# Patient Record
Sex: Male | Born: 1970
Health system: Southern US, Community
[De-identification: ages and names within clinical notes are randomized; demographics above are authoritative.]

## PROBLEM LIST (undated history)

## (undated) DIAGNOSIS — E119 Type 2 diabetes mellitus without complications: Secondary | ICD-10-CM

## (undated) DIAGNOSIS — K219 Gastro-esophageal reflux disease without esophagitis: Secondary | ICD-10-CM

## (undated) DIAGNOSIS — R748 Abnormal levels of other serum enzymes: Secondary | ICD-10-CM

## (undated) DIAGNOSIS — I10 Essential (primary) hypertension: Secondary | ICD-10-CM

## (undated) DIAGNOSIS — J189 Pneumonia, unspecified organism: Secondary | ICD-10-CM

## (undated) HISTORY — DX: Essential (primary) hypertension: I10

## (undated) HISTORY — PX: BACK SURGERY: SHX140

## (undated) HISTORY — DX: Gastro-esophageal reflux disease without esophagitis: K21.9

## (undated) SURGERY — Surgical Case
Anesthesia: *Unknown

---

## 2010-09-21 DIAGNOSIS — I10 Essential (primary) hypertension: Secondary | ICD-10-CM

## 2010-09-21 HISTORY — DX: Essential (primary) hypertension: I10

## 2010-09-21 HISTORY — PX: CARDIAC CATHETERIZATION: SHX172

## 2011-04-27 ENCOUNTER — Emergency Department (HOSPITAL_COMMUNITY)
Admission: EM | Admit: 2011-04-27 | Discharge: 2011-04-27 | Disposition: A | Discharge status: Home / Self Care | Payer: 59 | Attending: Emergency Medicine | Admitting: Emergency Medicine

## 2011-04-27 ENCOUNTER — Emergency Department (HOSPITAL_COMMUNITY): Payer: 59

## 2011-04-27 DIAGNOSIS — I10 Essential (primary) hypertension: Secondary | ICD-10-CM | POA: Insufficient documentation

## 2011-04-27 DIAGNOSIS — K219 Gastro-esophageal reflux disease without esophagitis: Secondary | ICD-10-CM | POA: Insufficient documentation

## 2011-04-27 DIAGNOSIS — Z7982 Long term (current) use of aspirin: Secondary | ICD-10-CM | POA: Insufficient documentation

## 2011-04-27 DIAGNOSIS — K449 Diaphragmatic hernia without obstruction or gangrene: Secondary | ICD-10-CM | POA: Insufficient documentation

## 2011-04-27 DIAGNOSIS — Z79899 Other long term (current) drug therapy: Secondary | ICD-10-CM | POA: Insufficient documentation

## 2011-04-27 DIAGNOSIS — R079 Chest pain, unspecified: Secondary | ICD-10-CM | POA: Insufficient documentation

## 2011-04-27 LAB — COMPREHENSIVE METABOLIC PANEL
ALT: 25 U/L (ref 0–53)
Alkaline Phosphatase: 116 U/L (ref 39–117)
CO2: 25 mEq/L (ref 19–32)
Chloride: 102 mEq/L (ref 96–112)
GFR calc Af Amer: >60 mL/min (ref >60)
Glucose, Bld: 100 mg/dL — ABNORMAL HIGH (ref 70–99)
Potassium: 4 mEq/L (ref 3.5–5.1)
Sodium: 135 mEq/L (ref 135–145)
Total Bilirubin: 0.3 mg/dL (ref 0.3–1.2)
Total Protein: 7.4 g/dL (ref 6.0–8.3)

## 2011-04-27 LAB — POCT I-STAT TROPONIN I: Troponin i, poc: 0.01 ng/mL (ref 0.00–0.08)

## 2011-04-27 LAB — CBC
HCT: 43 % (ref 39.0–52.0)
Hemoglobin: 14.5 g/dL (ref 13.0–17.0)
MCHC: 33.7 g/dL (ref 30.0–36.0)
RBC: 5.27 MIL/uL (ref 4.22–5.81)
WBC: 11.4 10*3/uL — ABNORMAL HIGH (ref 4.0–10.5)

## 2011-04-27 LAB — DIFFERENTIAL
Basophils Absolute: 0 10*3/uL (ref 0.0–0.1)
Basophils Relative: 0 % (ref 0–1)
Lymphocytes Relative: 22 % (ref 12–46)
Neutro Abs: 7.9 10*3/uL — ABNORMAL HIGH (ref 1.7–7.7)
Neutrophils Relative %: 69 % (ref 43–77)

## 2011-04-27 LAB — TROPONIN I: Troponin I: <0.3 ng/mL (ref <0.30)

## 2011-04-30 ENCOUNTER — Inpatient Hospital Stay (HOSPITAL_BASED_OUTPATIENT_CLINIC_OR_DEPARTMENT_OTHER)
Admission: RE | Admit: 2011-04-30 | Discharge: 2011-04-30 | Disposition: A | Discharge status: Home / Self Care | Payer: 59 | Source: Ambulatory Visit | Attending: Cardiology | Admitting: Cardiology

## 2011-04-30 DIAGNOSIS — K219 Gastro-esophageal reflux disease without esophagitis: Secondary | ICD-10-CM | POA: Insufficient documentation

## 2011-04-30 DIAGNOSIS — I10 Essential (primary) hypertension: Secondary | ICD-10-CM | POA: Insufficient documentation

## 2011-04-30 DIAGNOSIS — I2510 Atherosclerotic heart disease of native coronary artery without angina pectoris: Secondary | ICD-10-CM | POA: Insufficient documentation

## 2011-04-30 DIAGNOSIS — F17200 Nicotine dependence, unspecified, uncomplicated: Secondary | ICD-10-CM | POA: Insufficient documentation

## 2011-04-30 DIAGNOSIS — R079 Chest pain, unspecified: Secondary | ICD-10-CM | POA: Insufficient documentation

## 2011-04-30 DIAGNOSIS — R0602 Shortness of breath: Secondary | ICD-10-CM | POA: Insufficient documentation

## 2011-05-07 NOTE — Cardiovascular Report (Signed)
NAME:  HAIDAN, NHAN                 ACCOUNT NO.:  [id number]  MEDICAL RECORD NO.:  [id number]  LOCATION:  [facility]                         FACILITY:  [facility]  PHYSICIAN:  Abryana Lykens N. Sharyn Lull, M.D. DATE OF BIRTH:  [birth year]  DATE OF PROCEDURE:  04/30/2011 DATE OF DISCHARGE:                           CARDIAC CATHETERIZATION   PROCEDURE:  Left cardiac cath with selective left and right coronary angiography, LV graft via right groin using Judkins technique.  INDICATIONS FOR PROCEDURE:  Mr. Winer is 40 year old white male with past medical history significant for hypertension, GERD, morbid obesity, positive family history of coronary artery disease, his brother had MI at the age of 32, requiring PCI.  He came to the office complaining of recurrent retrosternal and precordial chest pain described as pressure, heaviness as if someone is sitting on his chest radiating to the inner side of the left arm associated with shortness of breath off and on for last 2 weeks.  States he was admitted at [facility] for 3 days had stress test which was negative for ischemia, but due to recurrent chest pain went to [facility] ER on Monday with similar chest pain, had lab workup and EKG which were negative and was discharged and was referred to the office for further followup.  The patient states chest pain is not related to food, breathing, or movement.  Denies any palpitation, lightheadedness, or syncope.  Denies PND, orthopnea, leg swelling and states pain occasionally occurs with rest and relieves with aspirin.  EKG done in the office showed normal sinus rhythm with LVH with nonspecific T-wave changes in the inferior leads.  PAST MEDICAL HISTORY:  As above.  PAST SURGICAL HISTORY:  None.  ALLERGIES:  None.  MEDICATIONS AT HOME:  He is on: 1. Propranolol 80 mg p.o. daily. 2. Omeprazole 20 mg p.o. daily. 3. Enteric-coated aspirin 81 mg daily. 4. Nitrostat sublingual p.r.n.  SOCIAL  HISTORY:  He is married, four children.  Smoking, works as Sports administrator, drinks beer occasionally.  No history of drug abuse.  FAMILY HISTORY:  Father died of cancer and mother is alive, she is diabetic and hypertensive.  Brother had MI at the age of 34 requiring PTCA stenting. Two uncles had MI in their 44s and 23s.  He has 6 sisters in good health.  PHYSICAL EXAMINATION:  GENERAL:  He is alert, awake, and oriented x3 in no acute distress. VITAL SIGNS:  Blood pressure was 130/88, pulse was 72 regular. HEENT:  Conjunctivae was pink. NECK:  Supple.  No JVD, no bruit. LUNGS:  Clear to auscultation without rhonchi or rales. Cardiovascular: S1 and S2 was normal.  There was soft systolic murmur. ABDOMEN:  Soft.  Bowel sounds were present, nontender. EXTREMITIES:  There is no clubbing, cyanosis, or edema.  IMPRESSION:  Recurrent chest pain with some features worrisome for angina, rule out coronary insufficiency, hypertension, morbid obesity, positive family history of coronary artery disease.  Discussed with patient and his wife regarding various options of treatment, i.e. medical as he had recent normal stress test versus left cath, its risks and benefits, i.e. death, MI, stroke, local vascular complications, etc. and the patient and his wife are  very concerned about his recurrent chest pain and wanted to proceed with left cath.  PROCEDURE:  After obtaining the informed consent, the patient was brought to the cath lab and was placed on fluoroscopy table.  The right groin was prepped and draped in usual fashion.  A 1% Xylocaine was used for local anesthesia in the right groin.  With the help of thin-wall needle, 4-French arterial sheath was placed.  The sheath was aspirated and flushed.  Next, 4-French left Judkins catheter was advanced over the wire under fluoroscopic guidance up to the ascending aorta.  Wire was pulled out.  The catheter was aspirated and connected to the  manifold. Catheter was further advanced and engaged into left coronary ostium. Multiple views of the left system were taken.  Next, catheter was disengaged and was pulled out over the wire and was replaced with 4- [place] right Judkins catheter which was advanced over the wire under fluoroscopic guidance up to the ascending aorta.  Wire was pulled out. The catheter was aspirated and connected to the manifold.  Catheter was further advanced and engaged into right coronary ostium.  Multiple views of the right system were taken.  Next, catheter was disengaged and was pulled out over the wire and was replaced with 4-French pigtail catheter which was advanced over the wire under fluoroscopic guidance up to the ascending aorta.  Wire was pulled out.  The catheter was aspirated and connected to the manifold.  Catheter was further advanced across the aortic valve into the LV.  LV pressures were recorded.  Next, LV graft was done in 30 degrees RAO position.  Post angiographic pressures were recorded from LV and then pullback pressures were recorded from aorta. There was no gradient across the aortic valve.  Next, the pigtail catheter was pulled out over the wire.  Sheaths were aspirated and flushed.  FINDINGS:  LV showed good LV systolic function, mild LVH, EF of 55-60%. Left main was short which was patent, LAD has 10-15% mid stenosis. Diagonal 1 is small which is patent.  Diagonal 2 is moderate size which is patent.  Left circumflex is patent.  OM1 and OM2 were very very small. OM3 and 4 were small which were patent.  RCA was patent.  PDA and PLV branches were patent.  The patient tolerated procedure well.  There were no complications.  The patient was transferred to recovery room in stable condition.     Eduardo Osier. Sharyn Lull, M.D.     [facility]MED[facility]  D:  04/30/2011  T:  04/30/2011  Job:  [id number]  Electronically Signed by Rinaldo Cloud M.D. on 05/07/2011 08:17:11 PM

## 2016-05-28 ENCOUNTER — Ambulatory Visit (INDEPENDENT_AMBULATORY_CARE_PROVIDER_SITE_OTHER): Payer: Managed Care, Other (non HMO) | Admitting: General Surgery

## 2016-05-28 ENCOUNTER — Encounter: Payer: Self-pay | Admitting: General Surgery

## 2016-05-28 VITALS — BP 148/82 | HR 80 | Resp 18 | Ht 72.0 in | Wt 295.0 lb

## 2016-05-28 DIAGNOSIS — R1032 Left lower quadrant pain: Secondary | ICD-10-CM | POA: Diagnosis not present
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
## 2016-05-28 NOTE — Patient Instructions (Addendum)
The patient is aware to call back for any questions or concerns. .  Recommend trial of anti inflammatory aleve two tablets twice a day, if pain persist then CT scan abdomen

## 2016-05-30 DIAGNOSIS — R1032 Left lower quadrant pain: Secondary | ICD-10-CM | POA: Insufficient documentation

## 2016-10-29 DIAGNOSIS — R7303 Prediabetes: Secondary | ICD-10-CM | POA: Diagnosis not present

## 2016-10-29 DIAGNOSIS — I10 Essential (primary) hypertension: Secondary | ICD-10-CM | POA: Diagnosis not present

## 2016-10-29 DIAGNOSIS — Z23 Encounter for immunization: Secondary | ICD-10-CM | POA: Diagnosis not present

## 2016-10-29 DIAGNOSIS — K219 Gastro-esophageal reflux disease without esophagitis: Secondary | ICD-10-CM | POA: Diagnosis not present

## 2016-10-29 DIAGNOSIS — G43009 Migraine without aura, not intractable, without status migrainosus: Secondary | ICD-10-CM | POA: Diagnosis not present

## 2016-10-29 DIAGNOSIS — E7800 Pure hypercholesterolemia, unspecified: Secondary | ICD-10-CM | POA: Diagnosis not present

## 2017-02-17 DIAGNOSIS — T1501XA Foreign body in cornea, right eye, initial encounter: Secondary | ICD-10-CM | POA: Diagnosis not present

## 2017-02-19 DIAGNOSIS — T1501XD Foreign body in cornea, right eye, subsequent encounter: Secondary | ICD-10-CM | POA: Diagnosis not present

## 2017-04-29 DIAGNOSIS — G43009 Migraine without aura, not intractable, without status migrainosus: Secondary | ICD-10-CM | POA: Diagnosis not present

## 2017-04-29 DIAGNOSIS — R7303 Prediabetes: Secondary | ICD-10-CM | POA: Diagnosis not present

## 2017-04-29 DIAGNOSIS — E7800 Pure hypercholesterolemia, unspecified: Secondary | ICD-10-CM | POA: Diagnosis not present

## 2017-04-29 DIAGNOSIS — I10 Essential (primary) hypertension: Secondary | ICD-10-CM | POA: Diagnosis not present

## 2017-08-08 DIAGNOSIS — Z23 Encounter for immunization: Secondary | ICD-10-CM | POA: Diagnosis not present

## 2017-11-09 DIAGNOSIS — G43009 Migraine without aura, not intractable, without status migrainosus: Secondary | ICD-10-CM | POA: Diagnosis not present

## 2017-11-09 DIAGNOSIS — I10 Essential (primary) hypertension: Secondary | ICD-10-CM | POA: Diagnosis not present

## 2017-11-09 DIAGNOSIS — E119 Type 2 diabetes mellitus without complications: Secondary | ICD-10-CM | POA: Diagnosis not present

## 2017-11-09 DIAGNOSIS — E7800 Pure hypercholesterolemia, unspecified: Secondary | ICD-10-CM | POA: Diagnosis not present

## 2018-05-11 DIAGNOSIS — I10 Essential (primary) hypertension: Secondary | ICD-10-CM | POA: Diagnosis not present

## 2018-05-11 DIAGNOSIS — E7800 Pure hypercholesterolemia, unspecified: Secondary | ICD-10-CM | POA: Diagnosis not present

## 2018-05-11 DIAGNOSIS — Z79899 Other long term (current) drug therapy: Secondary | ICD-10-CM | POA: Diagnosis not present

## 2018-05-11 DIAGNOSIS — E119 Type 2 diabetes mellitus without complications: Secondary | ICD-10-CM | POA: Diagnosis not present

## 2018-11-11 DIAGNOSIS — E119 Type 2 diabetes mellitus without complications: Secondary | ICD-10-CM | POA: Diagnosis not present

## 2018-11-11 DIAGNOSIS — E7800 Pure hypercholesterolemia, unspecified: Secondary | ICD-10-CM | POA: Diagnosis not present

## 2018-11-11 DIAGNOSIS — I10 Essential (primary) hypertension: Secondary | ICD-10-CM | POA: Diagnosis not present

## 2020-04-03 ENCOUNTER — Other Ambulatory Visit: Payer: Self-pay | Admitting: Nurse Practitioner

## 2020-04-04 ENCOUNTER — Other Ambulatory Visit: Payer: Self-pay | Admitting: Nurse Practitioner

## 2020-04-04 DIAGNOSIS — M5442 Lumbago with sciatica, left side: Secondary | ICD-10-CM

## 2020-04-30 ENCOUNTER — Ambulatory Visit
Admission: RE | Admit: 2020-04-30 | Discharge: 2020-04-30 | Disposition: A | Discharge status: Home / Self Care | Payer: Worker's Compensation | Source: Ambulatory Visit | Attending: Nurse Practitioner | Admitting: Nurse Practitioner

## 2020-04-30 ENCOUNTER — Other Ambulatory Visit: Payer: Self-pay

## 2020-04-30 DIAGNOSIS — M5442 Lumbago with sciatica, left side: Secondary | ICD-10-CM

## 2020-07-17 ENCOUNTER — Other Ambulatory Visit: Payer: Self-pay | Admitting: Orthopedic Surgery

## 2020-07-22 NOTE — Pre-Procedure Instructions (Addendum)
[id number] - [place], [place] [address] Phone: [phone] Fax: [phone]    Your procedure is scheduled on Thurs., Nov. 4, 2021 from 12:17PM-2:38PM  Report to [facility] Entrance "A" at 9:15AM   Call this number if you have problems the morning of surgery:  [phone]   Remember:  Do not eat after midnight on Nov. 3rd  You may drink clear liquids until (8:15AM) prior to surgery time .  Clear liquids allowed are: Water, Juice (Non-Citric, no pulp), Black Coffee Only (no dairy or creamer), Clear Tea (no dairy or creamer), Carbonated Beverages, Gatorade, Plain Popsicles, Plain Jell-O.  Enhanced Recovery after Surgery for Orthopedics Enhanced Recovery after Surgery is a protocol used to improve the stress on your body and your recovery after surgery.  Patient Instructions .  Marland Kitchen The day of surgery (if you do NOT have diabetes):  o Drink ONE (1) Pre-Surgery Clear Ensure by __8:15AM___  the morning of surgery   o This drink was given to you during your hospital  pre-op appointment visit. o Nothing else to drink after completing the  Pre-Surgery Clear Ensure.         If you have questions, please contact your surgeon's office.     Take these medicines the morning of surgery with A SIP OF WATER: HYDROcodone-acetaminophen (NORCO/VICODIN) Pantoprazole (PROTONIX)     Propranolol ER (INDERAL LA)  As of today, STOP taking all Aspirin (unless instructed by your doctor) and Other Aspirin containing products, Vitamins, Fish oils, and Herbal medications. Also stop all NSAIDS i.e. Advil, Ibuprofen, Motrin, Aleve, Anaprox, Naproxen, BC, Goody Powders, and all Supplements.   No Smoking of any kind, Tobacco/Vaping, or Alcohol products 24 hours prior to your procedure. If you use a Cpap at night, you may bring all equipment for your overnight stay.   Special instructions:   [place]- Preparing  For Surgery  Before surgery, you can play an important role. Because skin is not sterile, your skin needs to be as free of germs as possible. You can reduce the number of germs on your skin by washing with CHG (chlorahexidine gluconate) Soap before surgery.  CHG is an antiseptic cleaner which kills germs and bonds with the skin to continue killing germs even after washing.    Please do not use if you have an allergy to CHG or antibacterial soaps. If your skin becomes reddened/irritated stop using the CHG.  Do not shave (including legs and underarms) for at least 48 hours prior to first CHG shower. It is OK to shave your face.  Please follow these instructions carefully.   1. Shower the NIGHT BEFORE SURGERY and the MORNING OF SURGERY with CHG.   2. If you chose to wash your hair, wash your hair first as usual with your normal shampoo.  3. After you shampoo, rinse your hair and body thoroughly to remove the shampoo.  4. Use CHG as you would any other liquid soap. You can apply CHG directly to the skin and wash gently with a scrungie or a clean washcloth.   5. Apply the CHG Soap to your body ONLY FROM THE NECK DOWN.  Do not use on open wounds or open sores. Avoid contact with your eyes, ears, mouth and genitals (private parts). Wash Face and genitals (private parts)  with your normal soap.  6. Wash thoroughly, paying special attention to the area where your surgery will be performed.  7. Thoroughly rinse your body with warm water from the neck down.  8. DO NOT shower/wash with your normal soap after using and rinsing off the CHG Soap.  9. Pat yourself dry with a CLEAN TOWEL.  10. Wear CLEAN PAJAMAS to bed the night before surgery, wear comfortable clothes the morning of surgery  11. Place CLEAN SHEETS on your bed the night of your first shower and DO NOT SLEEP WITH PETS.   Day of Surgery:             Remember to brush your teeth WITH YOUR REGULAR TOOTHPASTE.   Do not wear jewelry.  Do  not wear lotions, powders, colognes, or deodorant.  Do not shave 48 hours prior to surgery.  Men may shave face and neck.  Do not bring valuables to the hospital.  [facility] is not responsible for any belongings or valuables.  Contacts, dentures or bridgework may not be worn into surgery.  For patients admitted to the hospital, discharge time will be determined by your treatment team.  Patients discharged the day of surgery will not be allowed to drive home, and someone age 50 and over needs to stay with them for 24 hours.  Please wear clean clothes to the hospital/surgery center.    Please read over the following fact sheets that you were given.

## 2020-07-23 ENCOUNTER — Encounter (HOSPITAL_COMMUNITY)
Admission: RE | Admit: 2020-07-23 | Discharge: 2020-07-23 | Disposition: A | Discharge status: Home / Self Care | Payer: Worker's Compensation | Source: Ambulatory Visit | Attending: Orthopedic Surgery | Admitting: Orthopedic Surgery

## 2020-07-23 ENCOUNTER — Other Ambulatory Visit: Payer: Self-pay

## 2020-07-23 ENCOUNTER — Encounter (HOSPITAL_COMMUNITY): Payer: Self-pay

## 2020-07-23 ENCOUNTER — Other Ambulatory Visit (HOSPITAL_COMMUNITY)
Admission: RE | Admit: 2020-07-23 | Discharge: 2020-07-23 | Disposition: A | Discharge status: Home / Self Care | Payer: 59 | Source: Ambulatory Visit | Attending: Orthopedic Surgery | Admitting: Orthopedic Surgery

## 2020-07-23 DIAGNOSIS — Z01812 Encounter for preprocedural laboratory examination: Secondary | ICD-10-CM | POA: Insufficient documentation

## 2020-07-23 DIAGNOSIS — Z20822 Contact with and (suspected) exposure to covid-19: Secondary | ICD-10-CM | POA: Insufficient documentation

## 2020-07-23 LAB — CBC WITH DIFFERENTIAL/PLATELET
Abs Immature Granulocytes: 0.05 10*3/uL (ref 0.00–0.07)
Basophils Absolute: 0 10*3/uL (ref 0.0–0.1)
Basophils Relative: 0 %
Eosinophils Absolute: 0.1 10*3/uL (ref 0.0–0.5)
Eosinophils Relative: 1 %
HCT: 46.1 % (ref 39.0–52.0)
Hemoglobin: 14.7 g/dL (ref 13.0–17.0)
Immature Granulocytes: 1 %
Lymphocytes Relative: 20 %
Lymphs Abs: 2.1 10*3/uL (ref 0.7–4.0)
MCH: 27.3 pg (ref 26.0–34.0)
MCHC: 31.9 g/dL (ref 30.0–36.0)
MCV: 85.5 fL (ref 80.0–100.0)
Monocytes Absolute: 0.7 10*3/uL (ref 0.1–1.0)
Monocytes Relative: 7 %
Neutro Abs: 7.7 10*3/uL (ref 1.7–7.7)
Neutrophils Relative %: 71 %
Platelets: 317 10*3/uL (ref 150–400)
RBC: 5.39 MIL/uL (ref 4.22–5.81)
RDW: 11.9 % (ref 11.5–15.5)
WBC: 10.7 10*3/uL — ABNORMAL HIGH (ref 4.0–10.5)
nRBC: 0 % (ref 0.0–0.2)

## 2020-07-23 LAB — URINALYSIS, ROUTINE W REFLEX MICROSCOPIC
Bilirubin Urine: NEGATIVE
Glucose, UA: NEGATIVE mg/dL
Hgb urine dipstick: NEGATIVE
Ketones, ur: NEGATIVE mg/dL
Leukocytes,Ua: NEGATIVE
Nitrite: NEGATIVE
Protein, ur: NEGATIVE mg/dL
Specific Gravity, Urine: 1.019 (ref 1.005–1.030)
pH: 7 (ref 5.0–8.0)

## 2020-07-23 LAB — COMPREHENSIVE METABOLIC PANEL
ALT: 51 U/L — ABNORMAL HIGH (ref 0–44)
AST: 48 U/L — ABNORMAL HIGH (ref 15–41)
Albumin: 4 g/dL (ref 3.5–5.0)
Alkaline Phosphatase: 103 U/L (ref 38–126)
Anion gap: 13 (ref 5–15)
BUN: 8 mg/dL (ref 6–20)
CO2: 23 mmol/L (ref 22–32)
Calcium: 9.2 mg/dL (ref 8.9–10.3)
Chloride: 100 mmol/L (ref 98–111)
Creatinine, Ser: 0.85 mg/dL (ref 0.61–1.24)
GFR, Estimated: >60 mL/min (ref >60)
Glucose, Bld: 206 mg/dL — ABNORMAL HIGH (ref 70–99)
Potassium: 4.2 mmol/L (ref 3.5–5.1)
Sodium: 136 mmol/L (ref 135–145)
Total Bilirubin: 0.8 mg/dL (ref 0.3–1.2)
Total Protein: 7.7 g/dL (ref 6.5–8.1)

## 2020-07-23 LAB — SURGICAL PCR SCREEN
MRSA, PCR: NEGATIVE
Staphylococcus aureus: NEGATIVE

## 2020-07-23 LAB — TYPE AND SCREEN
ABO/RH(D): O POS
Antibody Screen: NEGATIVE

## 2020-07-23 LAB — PROTIME-INR
INR: 1 (ref 0.8–1.2)
Prothrombin Time: 13.2 seconds (ref 11.4–15.2)

## 2020-07-23 LAB — APTT: aPTT: 30 seconds (ref 24–36)

## 2020-07-23 LAB — SARS CORONAVIRUS 2 (TAT 6-24 HRS): SARS Coronavirus 2: NEGATIVE

## 2020-07-23 NOTE — Progress Notes (Signed)
PCP - Cyndi Bender, PA-C Cardiologist - denies  PPM/ICD - denies  Chest x-ray - N/A EKG - 07/23/2020 Stress Test - denies ECHO - denies Cardiac Cath - per patient, 2012, no abnormal results, "doctor thought had blocked arteries but turned out to be a pulled muscle"  Sleep Study - denies CPAP -  N/A  DM: denies  Blood Thinner Instructions: N/A Aspirin Instructions: N/A  ERAS Protcol - Yes PRE-SURGERY Ensure or G2- Ensure given  COVID TEST- Scheduled for today 07/23/2020. Patient verbalized the understanding of self-quarantine instructions, appointment time and place.  Anesthesia review: YES, abnormal EKG  Patient denies shortness of breath, fever, cough and chest pain at PAT appointment  All instructions explained to the patient, with a verbal understanding of the material. Patient agrees to go over the instructions while at home for a better understanding. Patient also instructed to self quarantine after being tested for COVID-19. The opportunity to ask questions was provided.

## 2020-07-25 ENCOUNTER — Ambulatory Visit (HOSPITAL_COMMUNITY): Payer: Worker's Compensation | Admitting: Anesthesiology

## 2020-07-25 ENCOUNTER — Ambulatory Visit (HOSPITAL_COMMUNITY)
Admission: RE | Admit: 2020-07-25 | Discharge: 2020-07-25 | Disposition: A | Discharge status: Home / Self Care | Payer: Worker's Compensation | Attending: Orthopedic Surgery | Admitting: Orthopedic Surgery

## 2020-07-25 ENCOUNTER — Ambulatory Visit (HOSPITAL_COMMUNITY)
Admission: RE | Disposition: A | Discharge status: Home / Self Care | Payer: Self-pay | Source: Home / Self Care | Attending: Orthopedic Surgery

## 2020-07-25 ENCOUNTER — Ambulatory Visit (HOSPITAL_COMMUNITY): Payer: Worker's Compensation | Attending: Orthopedic Surgery

## 2020-07-25 ENCOUNTER — Other Ambulatory Visit: Payer: Self-pay

## 2020-07-25 ENCOUNTER — Ambulatory Visit (HOSPITAL_COMMUNITY): Payer: Worker's Compensation | Admitting: Physician Assistant

## 2020-07-25 ENCOUNTER — Encounter (HOSPITAL_COMMUNITY): Payer: Self-pay | Admitting: Orthopedic Surgery

## 2020-07-25 DIAGNOSIS — Z6841 Body Mass Index (BMI) 40.0 and over, adult: Secondary | ICD-10-CM | POA: Insufficient documentation

## 2020-07-25 DIAGNOSIS — M5410 Radiculopathy, site unspecified: Secondary | ICD-10-CM | POA: Insufficient documentation

## 2020-07-25 DIAGNOSIS — M5106 Intervertebral disc disorders with myelopathy, lumbar region: Secondary | ICD-10-CM | POA: Diagnosis not present

## 2020-07-25 DIAGNOSIS — M5116 Intervertebral disc disorders with radiculopathy, lumbar region: Secondary | ICD-10-CM | POA: Diagnosis not present

## 2020-07-25 HISTORY — PX: LUMBAR LAMINECTOMY/DECOMPRESSION MICRODISCECTOMY: SHX5026

## 2020-07-25 LAB — ABO/RH: ABO/RH(D): O POS

## 2020-07-25 SURGERY — LUMBAR LAMINECTOMY/DECOMPRESSION MICRODISCECTOMY
Anesthesia: General | Laterality: Left

## 2020-07-25 MED ORDER — ORAL CARE MOUTH RINSE: 15.0000 mL | Freq: Once | OROMUCOSAL | Status: AC

## 2020-07-25 MED ORDER — METHYLPREDNISOLONE ACETATE 40 MG/ML IJ SUSP
INTRAMUSCULAR | Status: AC
  Filled 2020-07-25: qty 1

## 2020-07-25 MED ORDER — PROMETHAZINE HCL 25 MG/ML IJ SOLN: 6.2500 mg | INTRAMUSCULAR | Status: DC | PRN

## 2020-07-25 MED ORDER — BUPIVACAINE LIPOSOME 1.3 % IJ SUSP
INTRAMUSCULAR | Status: DC | PRN
  Administered 2020-07-25: 20 mL

## 2020-07-25 MED ORDER — ROCURONIUM BROMIDE 10 MG/ML (PF) SYRINGE
PREFILLED_SYRINGE | INTRAVENOUS | Status: DC | PRN
  Administered 2020-07-25: 20 mg via INTRAVENOUS
  Administered 2020-07-25: 80 mg via INTRAVENOUS

## 2020-07-25 MED ORDER — PHENYLEPHRINE HCL-NACL 10-0.9 MG/250ML-% IV SOLN
INTRAVENOUS | Status: DC | PRN
  Administered 2020-07-25: 25 ug/min via INTRAVENOUS

## 2020-07-25 MED ORDER — MIDAZOLAM HCL 2 MG/2ML IJ SOLN
INTRAMUSCULAR | Status: DC | PRN
  Administered 2020-07-25: 2 mg via INTRAVENOUS

## 2020-07-25 MED ORDER — SUGAMMADEX SODIUM 200 MG/2ML IV SOLN
INTRAVENOUS | Status: DC | PRN
  Administered 2020-07-25: 300 mg via INTRAVENOUS

## 2020-07-25 MED ORDER — DEXAMETHASONE SODIUM PHOSPHATE 10 MG/ML IJ SOLN
INTRAMUSCULAR | Status: DC | PRN
  Administered 2020-07-25: 4 mg via INTRAVENOUS

## 2020-07-25 MED ORDER — ONDANSETRON HCL 4 MG/2ML IJ SOLN
INTRAMUSCULAR | Status: AC
  Filled 2020-07-25: qty 2

## 2020-07-25 MED ORDER — METHOCARBAMOL 500 MG PO TABS
500.0000 mg | ORAL_TABLET | Freq: Once | ORAL | Status: AC
  Administered 2020-07-25: 500 mg via ORAL

## 2020-07-25 MED ORDER — HYDROMORPHONE HCL 1 MG/ML IJ SOLN
INTRAMUSCULAR | Status: AC
  Filled 2020-07-25: qty 1

## 2020-07-25 MED ORDER — CEFAZOLIN SODIUM-DEXTROSE 2-4 GM/100ML-% IV SOLN
INTRAVENOUS | Status: AC
  Filled 2020-07-25: qty 100

## 2020-07-25 MED ORDER — CHLORHEXIDINE GLUCONATE 0.12 % MT SOLN
OROMUCOSAL | Status: AC
  Administered 2020-07-25: 15 mL via OROMUCOSAL
  Filled 2020-07-25: qty 15

## 2020-07-25 MED ORDER — EPHEDRINE 5 MG/ML INJ
INTRAVENOUS | Status: AC
  Filled 2020-07-25: qty 10

## 2020-07-25 MED ORDER — POVIDONE-IODINE 7.5 % EX SOLN
Freq: Once | CUTANEOUS | Status: AC
  Administered 2020-07-25: 1 via TOPICAL
  Filled 2020-07-25: qty 118

## 2020-07-25 MED ORDER — ONDANSETRON HCL 4 MG/2ML IJ SOLN
INTRAMUSCULAR | Status: DC | PRN
  Administered 2020-07-25: 4 mg via INTRAVENOUS

## 2020-07-25 MED ORDER — CEFAZOLIN SODIUM 1 G IJ SOLR
INTRAMUSCULAR | Status: AC
  Filled 2020-07-25: qty 10

## 2020-07-25 MED ORDER — MIDAZOLAM HCL 2 MG/2ML IJ SOLN
INTRAMUSCULAR | Status: AC
  Filled 2020-07-25: qty 2

## 2020-07-25 MED ORDER — SODIUM CHLORIDE (PF) 0.9 % IJ SOLN
INTRAMUSCULAR | Status: AC
  Filled 2020-07-25: qty 10

## 2020-07-25 MED ORDER — THROMBIN 20000 UNITS EX SOLR
CUTANEOUS | Status: DC | PRN
  Administered 2020-07-25: 10 mL via TOPICAL

## 2020-07-25 MED ORDER — MEPERIDINE HCL 25 MG/ML IJ SOLN: 6.2500 mg | INTRAMUSCULAR | Status: DC | PRN

## 2020-07-25 MED ORDER — METHYLENE BLUE 0.5 % INJ SOLN
INTRAVENOUS | Status: AC
  Filled 2020-07-25: qty 10

## 2020-07-25 MED ORDER — FENTANYL CITRATE (PF) 250 MCG/5ML IJ SOLN
INTRAMUSCULAR | Status: AC
  Filled 2020-07-25: qty 5

## 2020-07-25 MED ORDER — LIDOCAINE 2% (20 MG/ML) 5 ML SYRINGE
INTRAMUSCULAR | Status: AC
  Filled 2020-07-25: qty 5

## 2020-07-25 MED ORDER — PHENYLEPHRINE 40 MCG/ML (10ML) SYRINGE FOR IV PUSH (FOR BLOOD PRESSURE SUPPORT)
PREFILLED_SYRINGE | INTRAVENOUS | Status: DC | PRN
  Administered 2020-07-25: 120 ug via INTRAVENOUS

## 2020-07-25 MED ORDER — PROPOFOL 10 MG/ML IV BOLUS
INTRAVENOUS | Status: AC
  Filled 2020-07-25: qty 20

## 2020-07-25 MED ORDER — CEFAZOLIN SODIUM-DEXTROSE 2-4 GM/100ML-% IV SOLN: 2.0000 g | INTRAVENOUS | Status: DC

## 2020-07-25 MED ORDER — BUPIVACAINE-EPINEPHRINE 0.25% -1:200000 IJ SOLN
INTRAMUSCULAR | Status: DC | PRN
  Administered 2020-07-25: 20 mL

## 2020-07-25 MED ORDER — LIDOCAINE 2% (20 MG/ML) 5 ML SYRINGE
INTRAMUSCULAR | Status: DC | PRN
  Administered 2020-07-25: 100 mg via INTRAVENOUS

## 2020-07-25 MED ORDER — METHYLENE BLUE 0.5 % INJ SOLN
INTRAVENOUS | Status: DC | PRN
  Administered 2020-07-25: 1 mL via SUBMUCOSAL

## 2020-07-25 MED ORDER — HYDROMORPHONE HCL 1 MG/ML IJ SOLN
0.2500 mg | INTRAMUSCULAR | Status: DC | PRN
  Administered 2020-07-25 (×2): 0.5 mg via INTRAVENOUS

## 2020-07-25 MED ORDER — DEXAMETHASONE SODIUM PHOSPHATE 10 MG/ML IJ SOLN
INTRAMUSCULAR | Status: AC
  Filled 2020-07-25: qty 1

## 2020-07-25 MED ORDER — THROMBIN (RECOMBINANT) 20000 UNITS EX SOLR
CUTANEOUS | Status: AC
  Filled 2020-07-25: qty 20000

## 2020-07-25 MED ORDER — CHLORHEXIDINE GLUCONATE 0.12 % MT SOLN: 15.0000 mL | Freq: Once | OROMUCOSAL | Status: AC

## 2020-07-25 MED ORDER — METHOCARBAMOL 500 MG PO TABS
ORAL_TABLET | ORAL | Status: AC
  Filled 2020-07-25: qty 1

## 2020-07-25 MED ORDER — CHLORHEXIDINE GLUCONATE 0.12 % MT SOLN
OROMUCOSAL | Status: AC
  Filled 2020-07-25: qty 15

## 2020-07-25 MED ORDER — PROPOFOL 10 MG/ML IV BOLUS
INTRAVENOUS | Status: DC | PRN
  Administered 2020-07-25: 80 mg via INTRAVENOUS
  Administered 2020-07-25: 200 mg via INTRAVENOUS

## 2020-07-25 MED ORDER — BUPIVACAINE LIPOSOME 1.3 % IJ SUSP
20.0000 mL | Freq: Once | INTRAMUSCULAR | Status: DC
  Filled 2020-07-25: qty 20

## 2020-07-25 MED ORDER — ROCURONIUM BROMIDE 10 MG/ML (PF) SYRINGE
PREFILLED_SYRINGE | INTRAVENOUS | Status: AC
  Filled 2020-07-25: qty 10

## 2020-07-25 MED ORDER — 0.9 % SODIUM CHLORIDE (POUR BTL) OPTIME
TOPICAL | Status: DC | PRN
  Administered 2020-07-25: 1000 mL

## 2020-07-25 MED ORDER — BUPIVACAINE-EPINEPHRINE (PF) 0.25% -1:200000 IJ SOLN
INTRAMUSCULAR | Status: AC
  Filled 2020-07-25: qty 30

## 2020-07-25 MED ORDER — EPHEDRINE SULFATE-NACL 50-0.9 MG/10ML-% IV SOSY
PREFILLED_SYRINGE | INTRAVENOUS | Status: DC | PRN
  Administered 2020-07-25: 10 mg via INTRAVENOUS

## 2020-07-25 MED ORDER — LACTATED RINGERS IV SOLN: INTRAVENOUS | Status: DC

## 2020-07-25 MED ORDER — DEXTROSE 5 % IV SOLN
INTRAVENOUS | Status: DC | PRN
  Administered 2020-07-25: 3 g via INTRAVENOUS

## 2020-07-25 MED ORDER — FENTANYL CITRATE (PF) 250 MCG/5ML IJ SOLN
INTRAMUSCULAR | Status: DC | PRN
  Administered 2020-07-25: 100 ug via INTRAVENOUS
  Administered 2020-07-25: 50 ug via INTRAVENOUS

## 2020-07-25 SURGICAL SUPPLY — 64 items
BENZOIN TINCTURE PRP APPL 2/3 (GAUZE/BANDAGES/DRESSINGS) ×4 IMPLANT
BNDG GAUZE ELAST 4 BULKY (GAUZE/BANDAGES/DRESSINGS) ×2 IMPLANT
BUR ROUND PRECISION 4.0 (BURR) ×2 IMPLANT
CABLE BIPOLOR RESECTION CORD (MISCELLANEOUS) ×2 IMPLANT
CANISTER SUCT 3000ML PPV (MISCELLANEOUS) ×2 IMPLANT
CARTRIDGE OIL MAESTRO DRILL (MISCELLANEOUS) ×1 IMPLANT
CLOSURE STERI-STRIP 1/4X4 (GAUZE/BANDAGES/DRESSINGS) ×2 IMPLANT
COVER SURGICAL LIGHT HANDLE (MISCELLANEOUS) ×2 IMPLANT
COVER WAND RF STERILE (DRAPES) ×2 IMPLANT
DIFFUSER DRILL AIR PNEUMATIC (MISCELLANEOUS) ×2 IMPLANT
DRAPE POUCH INSTRU U-SHP 10X18 (DRAPES) ×4 IMPLANT
DRAPE SURG 17X23 STRL (DRAPES) ×8 IMPLANT
DURAPREP 26ML APPLICATOR (WOUND CARE) ×2 IMPLANT
ELECT BLADE 4.0 EZ CLEAN MEGAD (MISCELLANEOUS) ×2
ELECT CAUTERY BLADE 6.4 (BLADE) ×2 IMPLANT
ELECT REM PT RETURN 9FT ADLT (ELECTROSURGICAL) ×2
ELECTRODE BLDE 4.0 EZ CLN MEGD (MISCELLANEOUS) ×1 IMPLANT
ELECTRODE REM PT RTRN 9FT ADLT (ELECTROSURGICAL) ×1 IMPLANT
FILTER STRAW FLUID ASPIR (MISCELLANEOUS) ×2 IMPLANT
GAUZE 4X4 16PLY RFD (DISPOSABLE) ×4 IMPLANT
GAUZE SPONGE 4X4 12PLY STRL (GAUZE/BANDAGES/DRESSINGS) ×2 IMPLANT
GLOVE BIO SURGEON STRL SZ7 (GLOVE) ×2 IMPLANT
GLOVE BIO SURGEON STRL SZ8 (GLOVE) ×2 IMPLANT
GLOVE BIOGEL PI IND STRL 7.0 (GLOVE) ×1 IMPLANT
GLOVE BIOGEL PI IND STRL 8 (GLOVE) ×1 IMPLANT
GLOVE BIOGEL PI INDICATOR 7.0 (GLOVE) ×1
GLOVE BIOGEL PI INDICATOR 8 (GLOVE) ×1
GOWN STRL REUS W/ TWL LRG LVL3 (GOWN DISPOSABLE) ×1 IMPLANT
GOWN STRL REUS W/ TWL XL LVL3 (GOWN DISPOSABLE) ×2 IMPLANT
GOWN STRL REUS W/TWL LRG LVL3 (GOWN DISPOSABLE) ×1
GOWN STRL REUS W/TWL XL LVL3 (GOWN DISPOSABLE) ×2
IV CATH 14GX2 1/4 (CATHETERS) ×2 IMPLANT
KIT BASIN OR (CUSTOM PROCEDURE TRAY) ×2 IMPLANT
KIT POSITION SURG JACKSON T1 (MISCELLANEOUS) ×2 IMPLANT
KIT TURNOVER KIT B (KITS) ×2 IMPLANT
MARKER SKIN DUAL TIP RULER LAB (MISCELLANEOUS) ×2 IMPLANT
NEEDLE 18GX1X1/2 (RX/OR ONLY) (NEEDLE) ×2 IMPLANT
NEEDLE 22X1 1/2 (OR ONLY) (NEEDLE) ×2 IMPLANT
NEEDLE HYPO 25GX1X1/2 BEV (NEEDLE) ×2 IMPLANT
NEEDLE SPNL 18GX3.5 QUINCKE PK (NEEDLE) ×4 IMPLANT
NS IRRIG 1000ML POUR BTL (IV SOLUTION) ×2 IMPLANT
OIL CARTRIDGE MAESTRO DRILL (MISCELLANEOUS) ×2
PACK LAMINECTOMY ORTHO (CUSTOM PROCEDURE TRAY) ×2 IMPLANT
PACK UNIVERSAL I (CUSTOM PROCEDURE TRAY) ×2 IMPLANT
PAD ARMBOARD 7.5X6 YLW CONV (MISCELLANEOUS) ×4 IMPLANT
PATTIES SURGICAL .5 X.5 (GAUZE/BANDAGES/DRESSINGS) ×2 IMPLANT
PATTIES SURGICAL .5 X1 (DISPOSABLE) ×2 IMPLANT
SPONGE INTESTINAL PEANUT (DISPOSABLE) ×2 IMPLANT
SPONGE SURGIFOAM ABS GEL SZ50 (HEMOSTASIS) ×2 IMPLANT
STRIP CLOSURE SKIN 1/2X4 (GAUZE/BANDAGES/DRESSINGS) ×2 IMPLANT
SUT MNCRL AB 4-0 PS2 18 (SUTURE) ×2 IMPLANT
SUT VIC AB 1 CT1 18XCR BRD 8 (SUTURE) ×1 IMPLANT
SUT VIC AB 1 CT1 8-18 (SUTURE) ×1
SUT VIC AB 2-0 CT2 18 VCP726D (SUTURE) ×2 IMPLANT
SYR 20ML LL LF (SYRINGE) ×2 IMPLANT
SYR BULB IRRIG 60ML STRL (SYRINGE) ×2 IMPLANT
SYR CONTROL 10ML LL (SYRINGE) ×4 IMPLANT
SYR TB 1ML 25GX5/8 (SYRINGE) ×4 IMPLANT
SYR TB 1ML LUER SLIP (SYRINGE) ×4 IMPLANT
TAPE CLOTH SURG 4X10 WHT LF (GAUZE/BANDAGES/DRESSINGS) ×2 IMPLANT
TOWEL GREEN STERILE (TOWEL DISPOSABLE) ×2 IMPLANT
TOWEL GREEN STERILE FF (TOWEL DISPOSABLE) ×2 IMPLANT
WATER STERILE IRR 1000ML POUR (IV SOLUTION) ×2 IMPLANT
YANKAUER SUCT BULB TIP NO VENT (SUCTIONS) ×2 IMPLANT

## 2020-07-25 NOTE — Progress Notes (Signed)
Patient ambulated the length of PACU, tolerated walk well, no numbness or tingling, strong dorsi and plantar, sitting in recliner, VS stable.  Rowe Pavy, RN

## 2020-07-25 NOTE — Op Note (Signed)
PATIENT NAME: Brandon Marshall   MEDICAL RECORD NO.:   [id number]   DATE OF BIRTH: 1971-03-05   DATE OF PROCEDURE: 07/25/2020                                       OPERATIVE REPORT     PREOPERATIVE DIAGNOSES: 1. Left-sided L5 radiculopathy. 2. Left-sided L4/5 disk herniation causing      compression of the left L5 nerve. 3.  Morbid obesity (BMI of 43)   POSTOPERATIVE DIAGNOSES: 1. Left-sided L5 radiculopathy. 2. Left-sided L4/5 disk herniation causing      compression of the left L5 nerve. 3.  Morbid obesity (BMI of 43)   PROCEDURES:  Left-sided L4/5 laminotomy with partial facetectomy and removal of herniated left-sided L4/5 disk fragment.   SURGEON:  Phylliss Bob, MD.   ASSISTANTPricilla Holm, PA-C.   ANESTHESIA:  General endotracheal anesthesia.   COMPLICATIONS:  None.   DISPOSITION:  Stable.   ESTIMATED BLOOD LOSS:  Minimal.   INDICATIONS FOR SURGERY:  Briefly, Brandon Marshall  is a pleasant 49 year old male, who did present to me with severe pain in the left leg.  The patient's MRI did reveal the findings outlined above.  His pain is very much in the distribution of the left L5 nerve.  He did fail appropriate conservative treatment measures, and we did ultimately elect to proceed with the procedure reflected above.  The patient was fully made aware of the risks of surgery, including the risk of recurrent herniation and the need for subsequent surgery, including the possibility of a subsequent diskectomy and/or fusion.   OPERATIVE DETAILS:  On 07/25/2020, the patient was brought to surgery and general endotracheal anesthesia was administered.  The patient was placed prone on a well-padded flat Jackson bed with a spinal frame.  Antibiotics were given.  The back was prepped and draped and a time-out procedure was performed.  At this point, a midline incision was made directly over the L4/5 intervertebral space.  A curvilinear incision was made just to the left of the  midline into the fascia.   A Taylor retractor was secured over the lateral aspect of the L4-5 facet joint, and secured in place using a roll of Kerlix.  The lamina of L4 and L5 was identified and subperiosteally exposed, as was the ligamentum flavum.  Of note, this portion of the procedure was much more time-consuming than usual, given the patient's morbid obesity.  Safely approaching this region did take approximately 20 to 30 minutes longer than usual, in order to provide safe adequate exposure to proceed with additional aspects of the procedure. I was however able to adequately and safely visualize the intervertebral space. I then removed the lateral aspect of the L4/5 ligamentum flavum.  Readily identified was the traversing left L5 nerve, which was noted to be under obvious tension, and was noted to be rather erythematous.  I was able to gently gain medial retraction of the nerve, and in doing so, a large herniated disk fragment was readily noted.  The disc herniation was uneventfully removed in multiple fragments.   After doing this, I did use a Penfield 4 to palpate the lateral recess and annulus, and the annulus did continue to protrude.  Therefore, at this point, I did perform an annulotomy and did use micro pituitaries to remove additional protruded disc fragments just beneath the annulus.  This did additionally  decompress the nerve.  The annulus was noted to be partially calcified and slightly protruded, but was not compressive, and I did not feel that additional attempts at removing additional disc fragments or components of the annulus would provide any additional decompression of the nerve. At this point, the traversing left L5 nerve was evaluated, and was noted to be entirely free and mobile, and entirely free of any compression. I was very pleased with the final decompression that I was able to accomplish.  At this point, the wound was copiously irrigated with normal saline.  All epidural  bleeding was controlled using bipolar electrocautery in addition to Surgiflo. All bleeding was controlled at the termination of the procedure. The wound was then closed in layers using #1 Vicryl followed by 2-0 Vicryl, followed by 4-0 Monocryl. Benzoin and Steri-Strips were applied followed by a sterile dressing. All instrument counts were correct at the termination of the procedure.   Of note, Pricilla Holm was my assistant throughout surgery, and did aid in retraction, suctioning, and closure from start to finish.     Phylliss Bob, MD

## 2020-07-25 NOTE — Anesthesia Procedure Notes (Signed)
Procedure Name: Intubation Date/Time: 07/25/2020 12:07 PM Performed by: Thelma Comp, CRNA Pre-anesthesia Checklist: Patient identified, Emergency Drugs available, Suction available and Patient being monitored Patient Re-evaluated:Patient Re-evaluated prior to induction Oxygen Delivery Method: Circle System Utilized Preoxygenation: Pre-oxygenation with 100% oxygen Induction Type: IV induction Ventilation: Mask ventilation without difficulty Laryngoscope Size: Mac and 4 Grade View: Grade I Tube type: Oral Tube size: 7.5 mm Number of attempts: 1 Airway Equipment and Method: Stylet and Oral airway Placement Confirmation: ETT inserted through vocal cords under direct vision,  positive ETCO2 and breath sounds checked- equal and bilateral Secured at: 23 cm Tube secured with: Tape Dental Injury: Teeth and Oropharynx as per pre-operative assessment

## 2020-07-25 NOTE — Anesthesia Preprocedure Evaluation (Addendum)
Anesthesia Evaluation  Patient identified by MRN, date of birth, ID band Patient awake    Reviewed: Allergy & Precautions, NPO status , Patient's Chart, lab work & pertinent test results  Airway Mallampati: III  TM Distance: >3 FB Neck ROM: Full    Dental  (+) Dental Advisory Given, Poor Dentition, Missing,    Pulmonary neg pulmonary ROS,    Pulmonary exam normal breath sounds clear to auscultation       Cardiovascular hypertension, Pt. on medications and Pt. on home beta blockers Normal cardiovascular exam Rhythm:Regular Rate:Normal     Neuro/Psych negative neurological ROS     GI/Hepatic Neg liver ROS, GERD  ,  Endo/Other  Morbid obesity  Renal/GU negative Renal ROS     Musculoskeletal negative musculoskeletal ROS (+)   Abdominal (+) + obese,   Peds  Hematology negative hematology ROS (+)   Anesthesia Other Findings   Reproductive/Obstetrics                            Anesthesia Physical Anesthesia Plan  ASA: III  Anesthesia Plan: General   Post-op Pain Management:    Induction: Intravenous  PONV Risk Score and Plan: 3 and Ondansetron, Dexamethasone and Treatment may vary due to age or medical condition  Airway Management Planned: Oral ETT  Additional Equipment: None  Intra-op Plan:   Post-operative Plan: Extubation in OR  Informed Consent: I have reviewed the patients History and Physical, chart, labs and discussed the procedure including the risks, benefits and alternatives for the proposed anesthesia with the patient or authorized representative who has indicated his/her understanding and acceptance.     Dental advisory given  Plan Discussed with: CRNA  Anesthesia Plan Comments:        Anesthesia Quick Evaluation

## 2020-07-25 NOTE — H&P (Signed)
PREOPERATIVE H&P  Chief Complaint: Left leg pain  HPI: Brandon Marshall is a 49 y.o. male who presents with ongoing pain in the left leg  MRI reveals a moderate left L4/5 disc hernitaion  Patient has failed multiple forms of conservative care and continues to have pain (see office notes for additional details regarding the patient's full course of treatment)  Past Medical History:  Diagnosis Date  . GERD (gastroesophageal reflux disease)   . Hypertension 2012   Past Surgical History:  Procedure Laterality Date  . CARDIAC CATHETERIZATION  2012   Social History   Socioeconomic History  . Marital status: Married    Spouse name: Not on file  . Number of children: Not on file  . Years of education: Not on file  . Highest education level: Not on file  Occupational History  . Not on file  Tobacco Use  . Smoking status: Never Smoker  . Smokeless tobacco: Never Used  Vaping Use  . Vaping Use: Never used  Substance and Sexual Activity  . Alcohol use: No  . Drug use: No  . Sexual activity: Not on file  Other Topics Concern  . Not on file  Social History Narrative  . Not on file   Social Determinants of Health   Financial Resource Strain:   . Difficulty of Paying Living Expenses: Not on file  Food Insecurity:   . Worried About Charity fundraiser in the Last Year: Not on file  . Ran Out of Food in the Last Year: Not on file  Transportation Needs:   . Lack of Transportation (Medical): Not on file  . Lack of Transportation (Non-Medical): Not on file  Physical Activity:   . Days of Exercise per Week: Not on file  . Minutes of Exercise per Session: Not on file  Stress:   . Feeling of Stress : Not on file  Social Connections:   . Frequency of Communication with Friends and Family: Not on file  . Frequency of Social Gatherings with Friends and Family: Not on file  . Attends Religious Services: Not on file  . Active Member of Clubs or Organizations: Not on file  .  Attends Archivist Meetings: Not on file  . Marital Status: Not on file   Family History  Problem Relation Age of Onset  . Stomach cancer Father 10   No Known Allergies Prior to Admission medications   Medication Sig Start Date End Date Taking? Authorizing Provider  Ascorbic Acid (VITAMIN C PO) Take 1 tablet by mouth daily.   Yes [provider]  CINNAMON PO Take 1 capsule by mouth daily.    Yes [provider]  HYDROcodone-acetaminophen (NORCO/VICODIN) 5-325 MG tablet Take 1 tablet by mouth in the morning and at bedtime. 07/05/20  Yes [provider]  pantoprazole (PROTONIX) 40 MG tablet Take 40 mg by mouth daily.   Yes [provider]  propranolol ER (INDERAL LA) 160 MG SR capsule Take 160 mg by mouth daily. 06/19/20  Yes [provider]  vitamin B-12 (CYANOCOBALAMIN) 1000 MCG tablet Take 1,000 mcg by mouth daily.   Yes [provider]  VITAMIN D PO Take 1 tablet by mouth daily.   Yes [provider]  propranolol (INNOPRAN XL) 120 MG 24 hr capsule Take 120 mg by mouth daily. Patient not taking: Reported on 07/17/2020    [provider]     All other systems have been reviewed and were otherwise  negative with the exception of those mentioned in the HPI and as above.  Physical Exam: There were no vitals filed for this visit.  There is no height or weight on file to calculate BMI.  General: Alert, no acute distress Cardiovascular: No pedal edema Respiratory: No cyanosis, no use of accessory musculature Skin: No lesions in the area of chief complaint Neurologic: Sensation intact distally Psychiatric: Patient is competent for consent with normal mood and affect Lymphatic: No axillary or cervical lymphadenopathy  MUSCULOSKELETAL: + SLR on the left  Assessment/Plan: LEFT LUMBAR 5 RADICULOPATHY SECONDARY TO A MODERATE LEFT LUMBAR 4-LUMBAR 5 [place] for Procedure(s): LEFT-SIDED LUMBAR 4 -  LUMBAR 5 MICRODISECTOMY   Norva Karvonen, MD 07/25/2020 7:48 AM

## 2020-07-25 NOTE — Transfer of Care (Signed)
Immediate Anesthesia Transfer of Care Note  Patient: Brandon Marshall  Procedure(s) Performed: LEFT-SIDED LUMBAR 4 - LUMBAR 5 MICRODISECTOMY (Left )  Patient Location: PACU  Anesthesia Type:General  Level of Consciousness: alert , drowsy, patient cooperative and responds to stimulation  Airway & Oxygen Therapy: Patient Spontanous Breathing and Patient connected to face mask oxygen  Post-op Assessment: Report given to RN and Post -op Vital signs reviewed and stable  Post vital signs: Reviewed and stable  Last Vitals:  Vitals Value Taken Time  BP 116/74 07/25/20 1421  Temp    Pulse 73 07/25/20 1423  Resp 20 07/25/20 1422  SpO2 99 % 07/25/20 1423  Vitals shown include unvalidated device data.  Last Pain:  Vitals:   07/25/20 1024  TempSrc:   PainSc: 8       Patients Stated Pain Goal: 3 (03/35/33 1740)  Complications: No complications documented.

## 2020-07-26 ENCOUNTER — Encounter (HOSPITAL_COMMUNITY): Payer: Self-pay | Admitting: Orthopedic Surgery

## 2020-07-26 MED FILL — Thrombin (Recombinant) For Soln 20000 Unit: CUTANEOUS | Qty: 1 | Status: AC

## 2020-07-26 NOTE — Anesthesia Postprocedure Evaluation (Signed)
Anesthesia Post Note  Patient: Brandon Marshall  Procedure(s) Performed: LEFT-SIDED LUMBAR 4 - LUMBAR 5 MICRODISECTOMY (Left )     Patient location during evaluation: PACU Anesthesia Type: General Level of consciousness: sedated and patient cooperative Pain management: pain level controlled Vital Signs Assessment: post-procedure vital signs reviewed and stable Respiratory status: spontaneous breathing Cardiovascular status: stable Anesthetic complications: no   No complications documented.  Last Vitals:  Vitals:   07/25/20 1530 07/25/20 1533  BP: 140/88   Pulse: 72 70  Resp: 16 16  Temp: 36.9 C   SpO2: 96% 95%    Last Pain:  Vitals:   07/25/20 1505  TempSrc:   PainSc: [place]

## 2020-09-12 ENCOUNTER — Other Ambulatory Visit: Payer: Self-pay | Admitting: Orthopedic Surgery

## 2020-09-16 ENCOUNTER — Other Ambulatory Visit: Payer: Self-pay | Admitting: Orthopedic Surgery

## 2020-09-16 DIAGNOSIS — Z9889 Other specified postprocedural states: Secondary | ICD-10-CM

## 2020-09-19 ENCOUNTER — Other Ambulatory Visit: Payer: Self-pay | Admitting: Orthopedic Surgery

## 2020-09-19 ENCOUNTER — Other Ambulatory Visit: Payer: Self-pay

## 2020-09-19 ENCOUNTER — Ambulatory Visit
Admission: RE | Admit: 2020-09-19 | Discharge: 2020-09-19 | Disposition: A | Discharge status: Home / Self Care | Payer: Worker's Compensation | Source: Ambulatory Visit | Attending: Orthopedic Surgery | Admitting: Orthopedic Surgery

## 2020-09-19 DIAGNOSIS — Z9889 Other specified postprocedural states: Secondary | ICD-10-CM

## 2020-09-19 MED ORDER — GADOBENATE DIMEGLUMINE 529 MG/ML IV SOLN
20.0000 mL | Freq: Once | INTRAVENOUS | Status: AC | PRN
  Administered 2020-09-19: 20 mL via INTRAVENOUS

## 2020-09-26 DIAGNOSIS — M545 Low back pain, unspecified: Secondary | ICD-10-CM | POA: Diagnosis not present

## 2020-09-26 DIAGNOSIS — R195 Other fecal abnormalities: Secondary | ICD-10-CM | POA: Diagnosis not present

## 2020-09-26 DIAGNOSIS — G8929 Other chronic pain: Secondary | ICD-10-CM | POA: Diagnosis not present

## 2020-10-03 ENCOUNTER — Other Ambulatory Visit
Admission: RE | Admit: 2020-10-03 | Discharge: 2020-10-03 | Disposition: A | Discharge status: Home / Self Care | Payer: BC Managed Care – PPO | Source: Ambulatory Visit | Attending: Internal Medicine | Admitting: Internal Medicine

## 2020-10-03 ENCOUNTER — Other Ambulatory Visit: Payer: Self-pay

## 2020-10-03 DIAGNOSIS — Z20822 Contact with and (suspected) exposure to covid-19: Secondary | ICD-10-CM | POA: Diagnosis not present

## 2020-10-03 DIAGNOSIS — Z01812 Encounter for preprocedural laboratory examination: Secondary | ICD-10-CM | POA: Insufficient documentation

## 2020-10-03 LAB — SARS CORONAVIRUS 2 (TAT 6-24 HRS): SARS Coronavirus 2: NEGATIVE

## 2020-10-04 ENCOUNTER — Encounter: Payer: Self-pay | Admitting: Internal Medicine

## 2020-10-07 ENCOUNTER — Ambulatory Visit
Admission: RE | Admit: 2020-10-07 | Payer: BC Managed Care – PPO | Source: Home / Self Care | Admitting: Internal Medicine

## 2020-10-07 ENCOUNTER — Encounter: Admission: RE | Payer: Self-pay | Source: Home / Self Care

## 2020-10-07 SURGERY — COLONOSCOPY WITH PROPOFOL
Anesthesia: General

## 2020-10-28 ENCOUNTER — Other Ambulatory Visit
Admission: RE | Admit: 2020-10-28 | Discharge: 2020-10-28 | Disposition: A | Discharge status: Home / Self Care | Payer: BC Managed Care – PPO | Source: Ambulatory Visit | Attending: Internal Medicine | Admitting: Internal Medicine

## 2020-10-28 ENCOUNTER — Other Ambulatory Visit: Payer: Self-pay

## 2020-10-28 DIAGNOSIS — Z79899 Other long term (current) drug therapy: Secondary | ICD-10-CM | POA: Diagnosis not present

## 2020-10-28 DIAGNOSIS — K640 First degree hemorrhoids: Secondary | ICD-10-CM | POA: Diagnosis not present

## 2020-10-28 DIAGNOSIS — D122 Benign neoplasm of ascending colon: Secondary | ICD-10-CM | POA: Diagnosis not present

## 2020-10-28 DIAGNOSIS — Z01812 Encounter for preprocedural laboratory examination: Secondary | ICD-10-CM | POA: Insufficient documentation

## 2020-10-28 DIAGNOSIS — Z886 Allergy status to analgesic agent status: Secondary | ICD-10-CM | POA: Diagnosis not present

## 2020-10-28 DIAGNOSIS — K5730 Diverticulosis of large intestine without perforation or abscess without bleeding: Secondary | ICD-10-CM | POA: Diagnosis not present

## 2020-10-28 DIAGNOSIS — K635 Polyp of colon: Secondary | ICD-10-CM | POA: Diagnosis not present

## 2020-10-28 DIAGNOSIS — Z20822 Contact with and (suspected) exposure to covid-19: Secondary | ICD-10-CM | POA: Insufficient documentation

## 2020-10-28 DIAGNOSIS — Z791 Long term (current) use of non-steroidal anti-inflammatories (NSAID): Secondary | ICD-10-CM | POA: Diagnosis not present

## 2020-10-28 DIAGNOSIS — D125 Benign neoplasm of sigmoid colon: Secondary | ICD-10-CM | POA: Diagnosis not present

## 2020-10-28 DIAGNOSIS — R195 Other fecal abnormalities: Secondary | ICD-10-CM | POA: Diagnosis not present

## 2020-10-28 DIAGNOSIS — D124 Benign neoplasm of descending colon: Secondary | ICD-10-CM | POA: Diagnosis not present

## 2020-10-29 LAB — SARS CORONAVIRUS 2 (TAT 6-24 HRS): SARS Coronavirus 2: NEGATIVE

## 2020-10-30 ENCOUNTER — Encounter: Payer: Self-pay | Admitting: Internal Medicine

## 2020-10-30 ENCOUNTER — Ambulatory Visit
Admission: RE | Admit: 2020-10-30 | Discharge: 2020-10-30 | Disposition: A | Discharge status: Home / Self Care | Payer: BC Managed Care – PPO | Attending: Internal Medicine | Admitting: Internal Medicine

## 2020-10-30 ENCOUNTER — Ambulatory Visit: Payer: BC Managed Care – PPO | Admitting: Anesthesiology

## 2020-10-30 ENCOUNTER — Other Ambulatory Visit: Payer: Self-pay

## 2020-10-30 ENCOUNTER — Encounter
Admission: RE | Disposition: A | Discharge status: Home / Self Care | Payer: Self-pay | Source: Home / Self Care | Attending: Internal Medicine

## 2020-10-30 DIAGNOSIS — K5730 Diverticulosis of large intestine without perforation or abscess without bleeding: Secondary | ICD-10-CM | POA: Diagnosis not present

## 2020-10-30 DIAGNOSIS — Z79899 Other long term (current) drug therapy: Secondary | ICD-10-CM | POA: Insufficient documentation

## 2020-10-30 DIAGNOSIS — K635 Polyp of colon: Secondary | ICD-10-CM | POA: Insufficient documentation

## 2020-10-30 DIAGNOSIS — D122 Benign neoplasm of ascending colon: Secondary | ICD-10-CM | POA: Diagnosis not present

## 2020-10-30 DIAGNOSIS — D125 Benign neoplasm of sigmoid colon: Secondary | ICD-10-CM | POA: Insufficient documentation

## 2020-10-30 DIAGNOSIS — Z886 Allergy status to analgesic agent status: Secondary | ICD-10-CM | POA: Diagnosis not present

## 2020-10-30 DIAGNOSIS — K640 First degree hemorrhoids: Secondary | ICD-10-CM | POA: Diagnosis not present

## 2020-10-30 DIAGNOSIS — D126 Benign neoplasm of colon, unspecified: Secondary | ICD-10-CM | POA: Diagnosis not present

## 2020-10-30 DIAGNOSIS — R1032 Left lower quadrant pain: Secondary | ICD-10-CM | POA: Diagnosis not present

## 2020-10-30 DIAGNOSIS — D124 Benign neoplasm of descending colon: Secondary | ICD-10-CM | POA: Diagnosis not present

## 2020-10-30 DIAGNOSIS — Z20822 Contact with and (suspected) exposure to covid-19: Secondary | ICD-10-CM | POA: Insufficient documentation

## 2020-10-30 DIAGNOSIS — I10 Essential (primary) hypertension: Secondary | ICD-10-CM | POA: Diagnosis not present

## 2020-10-30 DIAGNOSIS — R195 Other fecal abnormalities: Secondary | ICD-10-CM | POA: Diagnosis not present

## 2020-10-30 DIAGNOSIS — Z791 Long term (current) use of non-steroidal anti-inflammatories (NSAID): Secondary | ICD-10-CM | POA: Diagnosis not present

## 2020-10-30 HISTORY — PX: COLONOSCOPY WITH PROPOFOL: SHX5780

## 2020-10-30 SURGERY — COLONOSCOPY WITH PROPOFOL
Anesthesia: General

## 2020-10-30 MED ORDER — GLYCOPYRROLATE 0.2 MG/ML IJ SOLN
INTRAMUSCULAR | Status: DC | PRN
  Administered 2020-10-30: .2 mg via INTRAVENOUS

## 2020-10-30 MED ORDER — PROPOFOL 10 MG/ML IV BOLUS
INTRAVENOUS | Status: DC | PRN
  Administered 2020-10-30: 40 mg via INTRAVENOUS

## 2020-10-30 MED ORDER — SODIUM CHLORIDE 0.9 % IV SOLN: INTRAVENOUS | Status: DC

## 2020-10-30 MED ORDER — PROPOFOL 500 MG/50ML IV EMUL
INTRAVENOUS | Status: DC | PRN
  Administered 2020-10-30: 165 ug/kg/min via INTRAVENOUS

## 2020-10-30 MED ORDER — LIDOCAINE HCL (CARDIAC) PF 100 MG/5ML IV SOSY
PREFILLED_SYRINGE | INTRAVENOUS | Status: DC | PRN
  Administered 2020-10-30: 60 mg via INTRAVENOUS
  Administered 2020-10-30: 40 mg via INTRAVENOUS

## 2020-10-30 NOTE — H&P (Signed)
Outpatient short stay form Pre-procedure 10/30/2020 2:02 PM Brandon Marshall K. [name], M.D.  Primary Physician: Cyndi Bender, PA-C  Reason for visit: Positive Cologuard stool test  History of present illness:  Patient is a pleasant 50 y/o male/male presenting on referral from primary care provider for a POSITIVE Cologuard result. Patient denies change in bowel habits, rectal bleeding, weight loss or abdominal pain.       Current Facility-Administered Medications:  .  0.9 %  sodium chloride infusion, , Intravenous, Continuous, [place], Benay Pike, MD, Last Rate: 20 mL/hr at 10/30/20 1344, New Bag at 10/30/20 1344  Medications Prior to Admission  Medication Sig Dispense Refill Last Dose  . Ascorbic Acid (VITAMIN C PO) Take 1 tablet by mouth daily.   Past Week at Unknown time  . celecoxib (CELEBREX) 200 MG capsule Take 200 mg by mouth daily.   10/29/2020 at Unknown time  . CINNAMON PO Take 1 capsule by mouth daily.    Past Week at Unknown time  . pantoprazole (PROTONIX) 40 MG tablet Take 40 mg by mouth daily.   10/29/2020 at Unknown time  . propranolol ER (INDERAL LA) 160 MG SR capsule Take 160 mg by mouth daily.   10/29/2020 at Unknown time  . vitamin B-12 (CYANOCOBALAMIN) 1000 MCG tablet Take 1,000 mcg by mouth daily.   Past Week at Unknown time  . VITAMIN D PO Take 1 tablet by mouth daily.   Past Week at Unknown time  . methocarbamol (ROBAXIN) 500 MG tablet Take 500 mg by mouth daily.        Allergies  Allergen Reactions  . Ibuprofen   . Neuromuscular Blocking Agents   . Tylenol [Acetaminophen]      Past Medical History:  Diagnosis Date  . GERD (gastroesophageal reflux disease)   . Hypertension 2012    Review of systems:  Otherwise negative.    Physical Exam  Gen: Alert, oriented. Appears stated age.  HEENT: [place]/AT. PERRLA. Lungs: CTA, no wheezes. CV: RR nl S1, S2. Abd: soft, benign, no masses. BS+ Ext: No edema. Pulses 2+    Planned procedures: Proceed with colonoscopy. The  patient understands the nature of the planned procedure, indications, risks, alternatives and potential complications including but not limited to bleeding, infection, perforation, damage to internal organs and possible oversedation/side effects from anesthesia. The patient agrees and gives consent to proceed.  Please refer to procedure notes for findings, recommendations and patient disposition/instructions.     Brandon Marshall K. [name], M.D. Gastroenterology 10/30/2020  2:02 PM

## 2020-10-30 NOTE — Addendum Note (Signed)
Addendum  created 10/30/20 1547 by Kelton Pillar, CRNA   Flowsheet accepted, Intraprocedure Meds edited

## 2020-10-30 NOTE — Op Note (Signed)
[facility] Patient Name: Acie Custis Procedure Date: 10/30/2020 2:39 PM MRN: [id number] Account #: [id number] Date of Birth: Mar 25, 1971 Admit Type: Outpatient Age: 50 Room: [facility] ENDO ROOM 2 Gender: Male Note Status: Finalized Procedure:             Colonoscopy Indications:           Positive Cologuard test Providers:             Benay Pike. [name] MD, MD Referring MD:          Cyndi Bender (Referring MD) Medicines:             Propofol per Anesthesia Complications:         No immediate complications. Procedure:             Pre-Anesthesia Assessment:                        - The risks and benefits of the procedure and the                         sedation options and risks were discussed with the                         patient. All questions were answered and informed                         consent was obtained.                        - Patient identification and proposed procedure were                         verified prior to the procedure by the nurse. The                         procedure was verified in the procedure room.                        - ASA Grade Assessment: III - A patient with severe                         systemic disease.                        - After reviewing the risks and benefits, the patient                         was deemed in satisfactory condition to undergo the                         procedure.                        After obtaining informed consent, the colonoscope was                         passed under direct vision. Throughout the procedure,                         the patient's blood pressure, pulse, and oxygen  saturations were monitored continuously. The                         Colonoscope was introduced through the anus and                         advanced to the the cecum, identified by appendiceal                         orifice and ileocecal valve. The colonoscopy was                          performed without difficulty. The patient tolerated                         the procedure well. The quality of the bowel                         preparation was adequate. The ileocecal valve,                         appendiceal orifice, and rectum were photographed. Findings:      The perianal and digital rectal examinations were normal. Pertinent       negatives include normal sphincter tone and no palpable rectal lesions.      Many small and large-mouthed diverticula were found in the sigmoid colon.      Non-bleeding internal hemorrhoids were found during retroflexion. The       hemorrhoids were Grade I (internal hemorrhoids that do not prolapse).      A 5 mm polyp was found in the ascending colon. The polyp was sessile.       The polyp was removed with a jumbo cold forceps. Resection and retrieval       were complete.      A 10 mm polyp was found in the descending colon. The polyp was       pedunculated. The polyp was removed with a hot snare. Resection and       retrieval were complete.      A 5 mm polyp was found in the proximal sigmoid colon. The polyp was       sessile. The polyp was removed with a jumbo cold forceps. Resection and       retrieval were complete.      Two sessile polyps were found in the mid sigmoid colon. The polyps were       5 to 6 mm in size. These polyps were removed with a hot snare. Resection       and retrieval were complete.      The exam was otherwise without abnormality. Impression:            - Diverticulosis in the sigmoid colon.                        - Non-bleeding internal hemorrhoids.                        - One 5 mm polyp in the ascending colon, removed with                         a jumbo cold forceps.  Resected and retrieved.                        - One 10 mm polyp in the descending colon, removed                         with a hot snare. Resected and retrieved.                        - One 5 mm polyp in the proximal sigmoid colon,                          removed with a jumbo cold forceps. Resected and                         retrieved.                        - Two 5 to 6 mm polyps in the mid sigmoid colon,                         removed with a hot snare. Resected and retrieved.                        - The examination was otherwise normal. Recommendation:        - Patient has a contact number available for                         emergencies. The signs and symptoms of potential                         delayed complications were discussed with the patient.                         Return to normal activities tomorrow. Written                         discharge instructions were provided to the patient.                        - Resume previous diet.                        - Continue present medications.                        - Repeat colonoscopy is recommended for surveillance.                         The colonoscopy date will be determined after                         pathology results from today's exam become available                         for review.                        - Return to GI office PRN.                        -  The findings and recommendations were discussed with                         the patient. Procedure Code(s):     --- Professional ---                        [id number], Colonoscopy, flexible; with removal of                         tumor(s), polyp(s), or other lesion(s) by snare                         technique                        45380, 66, Colonoscopy, flexible; with biopsy, single                         or multiple Diagnosis Code(s):     --- Professional ---                        K57.30, Diverticulosis of large intestine without                         perforation or abscess without bleeding                        R19.5, Other fecal abnormalities                        K63.5, Polyp of colon                        K64.0, First degree hemorrhoids CPT copyright 2019 American Medical  Association. All rights reserved. The codes documented in this report are preliminary and upon coder review may  be revised to meet current compliance requirements. Efrain Sella MD, MD 10/30/2020 3:18:50 PM This report has been signed electronically. Number of Addenda: 0 Note Initiated On: 10/30/2020 2:39 PM Scope Withdrawal Time: 0 hours 13 minutes 33 seconds  Total Procedure Duration: 0 hours 21 minutes 14 seconds  Estimated Blood Loss:  Estimated blood loss: none.      [facility]

## 2020-10-30 NOTE — Interval H&P Note (Signed)
History and Physical Interval Note:  10/30/2020 2:03 PM  Brandon Marshall  has presented today for surgery, with the diagnosis of [place].  The various methods of treatment have been discussed with the patient and family. After consideration of risks, benefits and other options for treatment, the patient has consented to  Procedure(s): COLONOSCOPY WITH PROPOFOL (N/A) as a surgical intervention.  The patient's history has been reviewed, patient examined, no change in status, stable for surgery.  I have reviewed the patient's chart and labs.  Questions were answered to the patient's satisfaction.     [place], [place]

## 2020-10-30 NOTE — Anesthesia Postprocedure Evaluation (Signed)
Anesthesia Post Note  Patient: Brandon Marshall  Procedure(s) Performed: COLONOSCOPY WITH PROPOFOL (N/A )  Patient location during evaluation: Endoscopy Anesthesia Type: General Level of consciousness: awake and alert Pain management: pain level controlled Vital Signs Assessment: post-procedure vital signs reviewed and stable Respiratory status: spontaneous breathing, nonlabored ventilation, respiratory function stable and patient connected to nasal cannula oxygen Cardiovascular status: blood pressure returned to baseline and stable Postop Assessment: no apparent nausea or vomiting Anesthetic complications: no   No complications documented.   Last Vitals:  Vitals:   10/30/20 1527 10/30/20 1537  BP: 122/73 117/70  Pulse: 69 67  Resp: 16 18  Temp:    SpO2: 97% 96%    Last Pain:  Vitals:   10/30/20 1537  TempSrc:   PainSc: 0-No pain                 Arita Miss

## 2020-10-30 NOTE — Anesthesia Preprocedure Evaluation (Signed)
Anesthesia Evaluation  Patient identified by MRN, date of birth, ID band Patient awake    Reviewed: Allergy & Precautions, NPO status , Patient's Chart, lab work & pertinent test results  History of Anesthesia Complications Negative for: history of anesthetic complications  Airway Mallampati: III  TM Distance: >3 FB Neck ROM: Full    Dental no notable dental hx. (+) Teeth Intact   Pulmonary neg pulmonary ROS, neg sleep apnea, neg COPD, Patient abstained from smoking.Not current smoker,    Pulmonary exam normal breath sounds clear to auscultation       Cardiovascular Exercise Tolerance: Good METShypertension, (-) CAD and (-) Past MI (-) dysrhythmias  Rhythm:Regular Rate:Normal - Systolic murmurs    Neuro/Psych negative neurological ROS  negative psych ROS   GI/Hepatic GERD  Medicated and Controlled,(+)     (-) substance abuse  ,   Endo/Other  neg diabetesMorbid obesity  Renal/GU negative Renal ROS     Musculoskeletal   Abdominal (+) + obese,   Peds  Hematology   Anesthesia Other Findings Past Medical History: No date: GERD (gastroesophageal reflux disease) 2012: Hypertension  Reproductive/Obstetrics                             Anesthesia Physical Anesthesia Plan  ASA: III  Anesthesia Plan: General   Post-op Pain Management:    Induction: Intravenous  PONV Risk Score and Plan: 2 and Ondansetron, Propofol infusion and TIVA  Airway Management Planned: Nasal Cannula  Additional Equipment: None  Intra-op Plan:   Post-operative Plan:   Informed Consent: I have reviewed the patients History and Physical, chart, labs and discussed the procedure including the risks, benefits and alternatives for the proposed anesthesia with the patient or authorized representative who has indicated his/her understanding and acceptance.     Dental advisory given  Plan Discussed with: CRNA and  Surgeon  Anesthesia Plan Comments: (Discussed risks of anesthesia with patient, including possibility of difficulty with spontaneous ventilation under anesthesia necessitating airway intervention, PONV, and rare risks such as cardiac or respiratory or neurological events. Patient understands.)        Anesthesia Quick Evaluation

## 2020-10-30 NOTE — Anesthesia Procedure Notes (Signed)
Procedure Name: General with mask airway Performed by: [place], Hisae Decoursey, CRNA Pre-anesthesia Checklist: Patient identified, Emergency Drugs available, Suction available and Patient being monitored Patient Re-evaluated:Patient Re-evaluated prior to induction Oxygen Delivery Method: Simple face mask Induction Type: IV induction Placement Confirmation: positive ETCO2 and CO2 detector Dental Injury: Teeth and Oropharynx as per pre-operative assessment        

## 2020-10-30 NOTE — Interval H&P Note (Signed)
History and Physical Interval Note:  10/30/2020 2:03 PM  Brandon Marshall  has presented today for surgery, with the diagnosis of [place].  The various methods of treatment have been discussed with the patient and family. After consideration of risks, benefits and other options for treatment, the patient has consented to  Procedure(s): COLONOSCOPY WITH PROPOFOL (N/A) as a surgical intervention.  The patient's history has been reviewed, patient examined, no change in status, stable for surgery.  I have reviewed the patient's chart and labs.  Questions were answered to the patient's satisfaction.     [place], [place]

## 2020-10-30 NOTE — Transfer of Care (Signed)
Immediate Anesthesia Transfer of Care Note  Patient: Brandon Marshall  Procedure(s) Performed: COLONOSCOPY WITH PROPOFOL (N/A )  Patient Location: Endoscopy Unit  Anesthesia Type:General  Level of Consciousness: awake, drowsy and patient cooperative  Airway & Oxygen Therapy: Patient Spontanous Breathing and Patient connected to face mask oxygen  Post-op Assessment: Report given to RN and Post -op Vital signs reviewed and stable  Post vital signs: Reviewed and stable  Last Vitals:  Vitals Value Taken Time  BP 106/68 10/30/20 1517  Temp    Pulse 74 10/30/20 1522  Resp 20 10/30/20 1522  SpO2 95 % 10/30/20 1522  Vitals shown include unvalidated device data.  Last Pain:  Vitals:   10/30/20 1517  TempSrc:   PainSc: 0-No pain         Complications: No complications documented.

## 2020-11-01 LAB — SURGICAL PATHOLOGY

## 2020-11-04 ENCOUNTER — Other Ambulatory Visit: Payer: Self-pay | Admitting: Orthopedic Surgery

## 2020-11-25 NOTE — Progress Notes (Signed)
Surgical Instructions  Your procedure is scheduled on Thursday, March 10th.  Report to [facility] Main Entrance "A" at 10:00 A.M., then check in with the Admitting office.  Call this number if you have problems the morning of surgery:  [phone]   If you have any questions prior to your surgery date call [phone]: Open Monday-Friday 8am-4pm   Remember:  Do not eat after midnight the night before your surgery  You may drink clear liquids until 10:00 A.M. the morning of your surgery.   Clear liquids allowed are: Water, Non-Citrus Juices (without pulp), Carbonated Beverages, Clear Tea, Black Coffee Only, and Gatorade  Enhanced Recovery after Surgery for Orthopedics Enhanced Recovery after Surgery is a protocol used to improve the stress on your body and your recovery after surgery.  Patient Instructions  . The night before surgery:  o No food after midnight. ONLY clear liquids after midnight   . The day of surgery (if you do NOT have diabetes):  o Drink ONE (1) Pre-Surgery Clear Ensure by 10:00 A.M. the morning of surgery. This drink was given to you during your hospital  pre-op appointment visit. o Nothing else to drink after completing the  Pre-Surgery Clear Ensure.         If you have questions, please contact your surgeon's office.    Take these medicines the morning of surgery with A SIP OF WATER  pantoprazole (PROTONIX)  propranolol ER (INDERAL LA)  As of today, STOP taking any Aspirin (unless otherwise instructed by your surgeon), aspirin-acetaminophen-caffeine (EXCEDRIN MIGRAINE), Aleve, Naproxen, Ibuprofen, Motrin, Advil, Goody's, BC's, all herbal medications, fish oil, and all vitamins.                     Do not wear jewelry            Do not wear lotions, powders, colognes, or deodorant.             Men may shave face and neck.            Do not bring valuables to the hospital.            [facility] is not responsible for any belongings or valuables.  Do  NOT Smoke (Tobacco/Vaping) or drink Alcohol 24 hours prior to your procedure If you use a CPAP at night, you may bring all equipment for your overnight stay.   Contacts, glasses, dentures or bridgework may not be worn into surgery, please bring cases for these belongings   For patients admitted to the hospital, discharge time will be determined by your treatment team.   Patients discharged the day of surgery will not be allowed to drive home, and someone needs to stay with them for 24 hours.  Special instructions:   [facility]- Preparing For Surgery  Before surgery, you can play an important role. Because skin is not sterile, your skin needs to be as free of germs as possible. You can reduce the number of germs on your skin by washing with CHG (chlorahexidine gluconate) Soap before surgery.  CHG is an antiseptic cleaner which kills germs and bonds with the skin to continue killing germs even after washing.    Oral Hygiene is also important to reduce your risk of infection.  Remember - BRUSH YOUR TEETH THE MORNING OF SURGERY WITH YOUR REGULAR TOOTHPASTE  Please do not use if you have an allergy to CHG or antibacterial soaps. If your skin becomes reddened/irritated stop using the CHG.  Do not shave (including legs and underarms) for at least 48 hours prior to first CHG shower. It is OK to shave your face.  Please follow these instructions carefully.   1. Shower the NIGHT BEFORE SURGERY and the MORNING OF SURGERY  2. If you chose to wash your hair, wash your hair first as usual with your normal shampoo.After you shampoo, rinse your hair and body thoroughly to remove the shampoo.  3. Wash Face and genitals (private parts) with your normal soap.   4. Use CHG Soap as you would any other liquid soap. You can apply CHG directly to the skin and wash gently with a scrungie or a clean washcloth.   5. Apply the CHG Soap to your body ONLY FROM THE NECK DOWN.  Do not use on open wounds or open sores.  Avoid contact with your eyes, ears, mouth and genitals (private parts). Wash Face and genitals (private parts)  with your normal soap.   6. Wash thoroughly, paying special attention to the area where your surgery will be performed.  7. Thoroughly rinse your body with warm water from the neck down.  8. DO NOT shower/wash with your normal soap after using and rinsing off the CHG Soap.  9. Pat yourself dry with a CLEAN TOWEL.  10. Wear CLEAN PAJAMAS to bed the night before surgery  11. Place CLEAN SHEETS on your bed the night before your surgery  12. DO NOT SLEEP WITH PETS.  Day of Surgery: Shower with CHG soap  Wear Clean/Comfortable clothing the morning of surgery Do not apply any deodorants/lotions.   Remember to brush your teeth WITH YOUR REGULAR TOOTHPASTE.   Please read over the following fact sheets that you were given.

## 2020-11-26 ENCOUNTER — Other Ambulatory Visit: Payer: Self-pay

## 2020-11-26 ENCOUNTER — Encounter (HOSPITAL_COMMUNITY)
Admission: RE | Admit: 2020-11-26 | Discharge: 2020-11-26 | Disposition: A | Discharge status: Home / Self Care | Payer: Worker's Compensation | Source: Ambulatory Visit | Attending: Orthopedic Surgery | Admitting: Orthopedic Surgery

## 2020-11-26 ENCOUNTER — Other Ambulatory Visit (HOSPITAL_COMMUNITY)
Admission: RE | Admit: 2020-11-26 | Discharge: 2020-11-26 | Disposition: A | Discharge status: Home / Self Care | Payer: BC Managed Care – PPO | Source: Ambulatory Visit | Attending: Orthopedic Surgery | Admitting: Orthopedic Surgery

## 2020-11-26 ENCOUNTER — Encounter (HOSPITAL_COMMUNITY): Payer: Self-pay | Admitting: Physician Assistant

## 2020-11-26 ENCOUNTER — Encounter (HOSPITAL_COMMUNITY): Payer: Self-pay

## 2020-11-26 DIAGNOSIS — Z01812 Encounter for preprocedural laboratory examination: Secondary | ICD-10-CM | POA: Insufficient documentation

## 2020-11-26 DIAGNOSIS — Z20822 Contact with and (suspected) exposure to covid-19: Secondary | ICD-10-CM | POA: Diagnosis not present

## 2020-11-26 HISTORY — DX: Abnormal levels of other serum enzymes: R74.8

## 2020-11-26 HISTORY — DX: Pneumonia, unspecified organism: J18.9

## 2020-11-26 LAB — COMPREHENSIVE METABOLIC PANEL
ALT: 39 U/L (ref 0–44)
AST: 31 U/L (ref 15–41)
Albumin: 3.6 g/dL (ref 3.5–5.0)
Alkaline Phosphatase: 86 U/L (ref 38–126)
Anion gap: 8 (ref 5–15)
BUN: 12 mg/dL (ref 6–20)
CO2: 26 mmol/L (ref 22–32)
Calcium: 9 mg/dL (ref 8.9–10.3)
Chloride: 102 mmol/L (ref 98–111)
Creatinine, Ser: 0.92 mg/dL (ref 0.61–1.24)
GFR, Estimated: >60 mL/min (ref >60)
Glucose, Bld: 247 mg/dL — ABNORMAL HIGH (ref 70–99)
Potassium: 3.8 mmol/L (ref 3.5–5.1)
Sodium: 136 mmol/L (ref 135–145)
Total Bilirubin: 0.8 mg/dL (ref 0.3–1.2)
Total Protein: 7.2 g/dL (ref 6.5–8.1)

## 2020-11-26 LAB — CBC WITH DIFFERENTIAL/PLATELET
Abs Immature Granulocytes: 0.05 10*3/uL (ref 0.00–0.07)
Basophils Absolute: 0 10*3/uL (ref 0.0–0.1)
Basophils Relative: 0 %
Eosinophils Absolute: 0.1 10*3/uL (ref 0.0–0.5)
Eosinophils Relative: 1 %
HCT: 42.9 % (ref 39.0–52.0)
Hemoglobin: 14 g/dL (ref 13.0–17.0)
Immature Granulocytes: 0 %
Lymphocytes Relative: 21 %
Lymphs Abs: 2.3 10*3/uL (ref 0.7–4.0)
MCH: 27.2 pg (ref 26.0–34.0)
MCHC: 32.6 g/dL (ref 30.0–36.0)
MCV: 83.3 fL (ref 80.0–100.0)
Monocytes Absolute: 0.6 10*3/uL (ref 0.1–1.0)
Monocytes Relative: 5 %
Neutro Abs: 8.3 10*3/uL — ABNORMAL HIGH (ref 1.7–7.7)
Neutrophils Relative %: 73 %
Platelets: 310 10*3/uL (ref 150–400)
RBC: 5.15 MIL/uL (ref 4.22–5.81)
RDW: 12.2 % (ref 11.5–15.5)
WBC: 11.4 10*3/uL — ABNORMAL HIGH (ref 4.0–10.5)
nRBC: 0 % (ref 0.0–0.2)

## 2020-11-26 LAB — URINALYSIS, ROUTINE W REFLEX MICROSCOPIC
Bilirubin Urine: NEGATIVE
Glucose, UA: NEGATIVE mg/dL
Hgb urine dipstick: NEGATIVE
Ketones, ur: NEGATIVE mg/dL
Leukocytes,Ua: NEGATIVE
Nitrite: NEGATIVE
Protein, ur: NEGATIVE mg/dL
Specific Gravity, Urine: 1.023 (ref 1.005–1.030)
pH: 5 (ref 5.0–8.0)

## 2020-11-26 LAB — TYPE AND SCREEN
ABO/RH(D): O POS
Antibody Screen: NEGATIVE

## 2020-11-26 LAB — HEMOGLOBIN A1C
Hgb A1c MFr Bld: 7.9 % — ABNORMAL HIGH (ref 4.8–5.6)
Mean Plasma Glucose: 180.03 mg/dL

## 2020-11-26 LAB — SURGICAL PCR SCREEN
MRSA, PCR: NEGATIVE
Staphylococcus aureus: NEGATIVE

## 2020-11-26 LAB — APTT: aPTT: 29 seconds (ref 24–36)

## 2020-11-26 LAB — PROTIME-INR
INR: 1.1 (ref 0.8–1.2)
Prothrombin Time: 13.4 seconds (ref 11.4–15.2)

## 2020-11-26 LAB — SARS CORONAVIRUS 2 (TAT 6-24 HRS): SARS Coronavirus 2: NEGATIVE

## 2020-11-26 NOTE — Progress Notes (Signed)
   11/26/20 0911  OBSTRUCTIVE SLEEP APNEA  Have you ever been diagnosed with sleep apnea through a sleep study? No  Do you snore loudly (loud enough to be heard through closed doors)?  0  Do you often feel tired, fatigued, or sleepy during the daytime (such as falling asleep during driving or talking to someone)? 0  Has anyone observed you stop breathing during your sleep? 0  Do you have, or are you being treated for high blood pressure? 1  BMI more than 35 kg/m2? 1  Age > 50 (1-yes) 1  Neck circumference greater than:Male 16 inches or larger, Male 17inches or larger? 1  Male Gender (Yes=1) 1  Obstructive Sleep Apnea Score 5  Score 5 or greater  Results sent to PCP

## 2020-11-26 NOTE — Progress Notes (Signed)
PCP - Cyndi Bender, PA Cardiologist - n/a  PPM/ICD - n/a Device Orders -  Rep Notified -   Chest x-ray - n/a EKG - 07/23/2020 Stress Test - 20+ years ago, no follow up needed ECHO - patient denies  Cardiac Cath - 2012  Sleep Study - patient denies, positive stop bang, forwarded to PCP CPAP -   Fasting Blood Sugar - n/a Checks Blood Sugar _____ times a day  Blood Thinner Instructions: n/a Aspirin Instructions: n/a  ERAS Protcol - clears until 10:74m PRE-SURGERY Ensure or G2- Ensure provided, to be completed by 10:00am  COVID TEST- after PAT appointment   Anesthesia review: n/a  Patient denies shortness of breath, fever, cough and chest pain at PAT appointment   All instructions explained to the patient, with a verbal understanding of the material. Patient agrees to go over the instructions while at home for a better understanding. Patient also instructed to self quarantine after being tested for COVID-19. The opportunity to ask questions was provided.

## 2020-11-28 ENCOUNTER — Encounter (HOSPITAL_COMMUNITY): Admission: RE | Payer: Self-pay | Source: Home / Self Care

## 2020-11-28 ENCOUNTER — Inpatient Hospital Stay (HOSPITAL_COMMUNITY)
Admission: RE | Admit: 2020-11-28 | Payer: Worker's Compensation | Source: Home / Self Care | Admitting: Orthopedic Surgery

## 2020-11-28 SURGERY — TRANSFORAMINAL LUMBAR INTERBODY FUSION (TLIF) WITH PEDICLE SCREW FIXATION 1 LEVEL
Anesthesia: General | Laterality: Left

## 2020-11-29 DIAGNOSIS — Z1211 Encounter for screening for malignant neoplasm of colon: Secondary | ICD-10-CM | POA: Diagnosis not present

## 2020-11-29 DIAGNOSIS — E119 Type 2 diabetes mellitus without complications: Secondary | ICD-10-CM | POA: Diagnosis not present

## 2020-11-29 DIAGNOSIS — I10 Essential (primary) hypertension: Secondary | ICD-10-CM | POA: Diagnosis not present

## 2020-12-09 DIAGNOSIS — I10 Essential (primary) hypertension: Secondary | ICD-10-CM | POA: Diagnosis not present

## 2020-12-09 DIAGNOSIS — E7800 Pure hypercholesterolemia, unspecified: Secondary | ICD-10-CM | POA: Diagnosis not present

## 2020-12-09 DIAGNOSIS — G43009 Migraine without aura, not intractable, without status migrainosus: Secondary | ICD-10-CM | POA: Diagnosis not present

## 2020-12-09 DIAGNOSIS — E119 Type 2 diabetes mellitus without complications: Secondary | ICD-10-CM | POA: Diagnosis not present

## 2020-12-11 ENCOUNTER — Other Ambulatory Visit: Payer: Self-pay | Admitting: Orthopedic Surgery

## 2020-12-23 ENCOUNTER — Other Ambulatory Visit (HOSPITAL_COMMUNITY)
Admission: RE | Admit: 2020-12-23 | Discharge: 2020-12-23 | Disposition: A | Discharge status: Home / Self Care | Payer: BC Managed Care – PPO | Source: Ambulatory Visit | Attending: Orthopedic Surgery | Admitting: Orthopedic Surgery

## 2020-12-23 DIAGNOSIS — Z01812 Encounter for preprocedural laboratory examination: Secondary | ICD-10-CM | POA: Insufficient documentation

## 2020-12-23 DIAGNOSIS — Z20822 Contact with and (suspected) exposure to covid-19: Secondary | ICD-10-CM | POA: Insufficient documentation

## 2020-12-23 LAB — SARS CORONAVIRUS 2 (TAT 6-24 HRS): SARS Coronavirus 2: NEGATIVE

## 2020-12-24 ENCOUNTER — Encounter (HOSPITAL_COMMUNITY): Payer: Self-pay | Admitting: Orthopedic Surgery

## 2020-12-24 NOTE — Progress Notes (Signed)
Unable to reach patient.  Left arrival time and instructions on voice mail.

## 2020-12-25 ENCOUNTER — Other Ambulatory Visit: Payer: Self-pay

## 2020-12-25 ENCOUNTER — Inpatient Hospital Stay (HOSPITAL_COMMUNITY): Payer: Worker's Compensation | Admitting: Certified Registered Nurse Anesthetist

## 2020-12-25 ENCOUNTER — Encounter (HOSPITAL_COMMUNITY): Payer: Self-pay | Admitting: Orthopedic Surgery

## 2020-12-25 ENCOUNTER — Inpatient Hospital Stay (HOSPITAL_COMMUNITY): Payer: Worker's Compensation

## 2020-12-25 ENCOUNTER — Inpatient Hospital Stay (HOSPITAL_COMMUNITY)
Admission: RE | Disposition: A | Discharge status: Home / Self Care | Payer: Self-pay | Source: Home / Self Care | Attending: Orthopedic Surgery

## 2020-12-25 ENCOUNTER — Inpatient Hospital Stay (HOSPITAL_COMMUNITY)
Admission: RE | Admit: 2020-12-25 | Discharge: 2020-12-26 | DRG: 455 | Disposition: A | Discharge status: Home / Self Care | Payer: Worker's Compensation | Attending: Orthopedic Surgery | Admitting: Orthopedic Surgery

## 2020-12-25 DIAGNOSIS — M5116 Intervertebral disc disorders with radiculopathy, lumbar region: Secondary | ICD-10-CM | POA: Diagnosis present

## 2020-12-25 DIAGNOSIS — Z79890 Hormone replacement therapy (postmenopausal): Secondary | ICD-10-CM

## 2020-12-25 DIAGNOSIS — Z800 Family history of malignant neoplasm of digestive organs: Secondary | ICD-10-CM

## 2020-12-25 DIAGNOSIS — E119 Type 2 diabetes mellitus without complications: Secondary | ICD-10-CM | POA: Diagnosis present

## 2020-12-25 DIAGNOSIS — I10 Essential (primary) hypertension: Secondary | ICD-10-CM | POA: Diagnosis present

## 2020-12-25 DIAGNOSIS — Z888 Allergy status to other drugs, medicaments and biological substances status: Secondary | ICD-10-CM

## 2020-12-25 DIAGNOSIS — Z79899 Other long term (current) drug therapy: Secondary | ICD-10-CM | POA: Diagnosis not present

## 2020-12-25 DIAGNOSIS — K219 Gastro-esophageal reflux disease without esophagitis: Secondary | ICD-10-CM | POA: Diagnosis present

## 2020-12-25 DIAGNOSIS — Z7984 Long term (current) use of oral hypoglycemic drugs: Secondary | ICD-10-CM

## 2020-12-25 DIAGNOSIS — M5410 Radiculopathy, site unspecified: Secondary | ICD-10-CM | POA: Diagnosis present

## 2020-12-25 DIAGNOSIS — Z419 Encounter for procedure for purposes other than remedying health state, unspecified: Secondary | ICD-10-CM

## 2020-12-25 HISTORY — PX: TRANSFORAMINAL LUMBAR INTERBODY FUSION (TLIF) WITH PEDICLE SCREW FIXATION 1 LEVEL: SHX6141

## 2020-12-25 HISTORY — DX: Type 2 diabetes mellitus without complications: E11.9

## 2020-12-25 LAB — CBC WITH DIFFERENTIAL/PLATELET
Abs Immature Granulocytes: 0.04 10*3/uL (ref 0.00–0.07)
Basophils Absolute: 0 10*3/uL (ref 0.0–0.1)
Basophils Relative: 0 %
Eosinophils Absolute: 0.1 10*3/uL (ref 0.0–0.5)
Eosinophils Relative: 1 %
HCT: 45.1 % (ref 39.0–52.0)
Hemoglobin: 14.4 g/dL (ref 13.0–17.0)
Immature Granulocytes: 0 %
Lymphocytes Relative: 25 %
Lymphs Abs: 2.4 10*3/uL (ref 0.7–4.0)
MCH: 27.2 pg (ref 26.0–34.0)
MCHC: 31.9 g/dL (ref 30.0–36.0)
MCV: 85.1 fL (ref 80.0–100.0)
Monocytes Absolute: 0.8 10*3/uL (ref 0.1–1.0)
Monocytes Relative: 8 %
Neutro Abs: 6.5 10*3/uL (ref 1.7–7.7)
Neutrophils Relative %: 66 %
Platelets: 279 10*3/uL (ref 150–400)
RBC: 5.3 MIL/uL (ref 4.22–5.81)
RDW: 12.9 % (ref 11.5–15.5)
WBC: 9.9 10*3/uL (ref 4.0–10.5)
nRBC: 0 % (ref 0.0–0.2)

## 2020-12-25 LAB — URINALYSIS, ROUTINE W REFLEX MICROSCOPIC
Bacteria, UA: NONE SEEN
Bilirubin Urine: NEGATIVE
Glucose, UA: >=500 mg/dL — AB
Hgb urine dipstick: NEGATIVE
Ketones, ur: NEGATIVE mg/dL
Leukocytes,Ua: NEGATIVE
Nitrite: NEGATIVE
Protein, ur: NEGATIVE mg/dL
Specific Gravity, Urine: 1.03 (ref 1.005–1.030)
pH: 6 (ref 5.0–8.0)

## 2020-12-25 LAB — COMPREHENSIVE METABOLIC PANEL
ALT: 38 U/L (ref 0–44)
AST: 23 U/L (ref 15–41)
Albumin: 3.7 g/dL (ref 3.5–5.0)
Alkaline Phosphatase: 77 U/L (ref 38–126)
Anion gap: 6 (ref 5–15)
BUN: 11 mg/dL (ref 6–20)
CO2: 25 mmol/L (ref 22–32)
Calcium: 9.1 mg/dL (ref 8.9–10.3)
Chloride: 107 mmol/L (ref 98–111)
Creatinine, Ser: 0.93 mg/dL (ref 0.61–1.24)
GFR, Estimated: >60 mL/min (ref >60)
Glucose, Bld: 108 mg/dL — ABNORMAL HIGH (ref 70–99)
Potassium: 3.9 mmol/L (ref 3.5–5.1)
Sodium: 138 mmol/L (ref 135–145)
Total Bilirubin: 0.5 mg/dL (ref 0.3–1.2)
Total Protein: 7.2 g/dL (ref 6.5–8.1)

## 2020-12-25 LAB — GLUCOSE, CAPILLARY
Glucose-Capillary: 128 mg/dL — ABNORMAL HIGH (ref 70–99)
Glucose-Capillary: 128 mg/dL — ABNORMAL HIGH (ref 70–99)
Glucose-Capillary: 179 mg/dL — ABNORMAL HIGH (ref 70–99)

## 2020-12-25 LAB — APTT: aPTT: 29 seconds (ref 24–36)

## 2020-12-25 LAB — TYPE AND SCREEN
ABO/RH(D): O POS
Antibody Screen: NEGATIVE

## 2020-12-25 LAB — PROTIME-INR
INR: 1.1 (ref 0.8–1.2)
Prothrombin Time: 13.4 seconds (ref 11.4–15.2)

## 2020-12-25 SURGERY — TRANSFORAMINAL LUMBAR INTERBODY FUSION (TLIF) WITH PEDICLE SCREW FIXATION 1 LEVEL
Anesthesia: General | Laterality: Left

## 2020-12-25 MED ORDER — FENTANYL CITRATE (PF) 250 MCG/5ML IJ SOLN
INTRAMUSCULAR | Status: AC
  Filled 2020-12-25: qty 5

## 2020-12-25 MED ORDER — PROPOFOL 10 MG/ML IV BOLUS
INTRAVENOUS | Status: AC
  Filled 2020-12-25: qty 20

## 2020-12-25 MED ORDER — FENTANYL CITRATE (PF) 250 MCG/5ML IJ SOLN
INTRAMUSCULAR | Status: DC | PRN
  Administered 2020-12-25: 50 ug via INTRAVENOUS
  Administered 2020-12-25: 150 ug via INTRAVENOUS
  Administered 2020-12-25: 50 ug via INTRAVENOUS

## 2020-12-25 MED ORDER — MIDAZOLAM HCL 2 MG/2ML IJ SOLN
INTRAMUSCULAR | Status: DC | PRN
  Administered 2020-12-25: 2 mg via INTRAVENOUS

## 2020-12-25 MED ORDER — ONDANSETRON HCL 4 MG PO TABS: 4.0000 mg | ORAL_TABLET | Freq: Four times a day (QID) | ORAL | Status: DC | PRN

## 2020-12-25 MED ORDER — BISACODYL 5 MG PO TBEC: 5.0000 mg | DELAYED_RELEASE_TABLET | Freq: Every day | ORAL | Status: DC | PRN

## 2020-12-25 MED ORDER — METHOCARBAMOL 500 MG PO TABS
ORAL_TABLET | ORAL | Status: AC
  Filled 2020-12-25: qty 1

## 2020-12-25 MED ORDER — ONDANSETRON HCL 4 MG/2ML IJ SOLN
INTRAMUSCULAR | Status: AC
  Filled 2020-12-25: qty 2

## 2020-12-25 MED ORDER — METHOCARBAMOL 1000 MG/10ML IJ SOLN
500.0000 mg | Freq: Four times a day (QID) | INTRAMUSCULAR | Status: DC | PRN
  Filled 2020-12-25: qty 5

## 2020-12-25 MED ORDER — SUCCINYLCHOLINE CHLORIDE 200 MG/10ML IV SOSY
PREFILLED_SYRINGE | INTRAVENOUS | Status: AC
  Filled 2020-12-25: qty 10

## 2020-12-25 MED ORDER — ONDANSETRON HCL 4 MG/2ML IJ SOLN
4.0000 mg | Freq: Four times a day (QID) | INTRAMUSCULAR | Status: DC | PRN
  Administered 2020-12-25: 4 mg via INTRAVENOUS
  Filled 2020-12-25: qty 2

## 2020-12-25 MED ORDER — MENTHOL 3 MG MT LOZG: 1.0000 | LOZENGE | OROMUCOSAL | Status: DC | PRN

## 2020-12-25 MED ORDER — HYDROMORPHONE HCL 1 MG/ML IJ SOLN
0.2500 mg | INTRAMUSCULAR | Status: DC | PRN
  Administered 2020-12-25: 0.5 mg via INTRAVENOUS

## 2020-12-25 MED ORDER — CEFAZOLIN SODIUM-DEXTROSE 2-4 GM/100ML-% IV SOLN: 2.0000 g | INTRAVENOUS | Status: DC

## 2020-12-25 MED ORDER — MORPHINE SULFATE (PF) 2 MG/ML IV SOLN: 1.0000 mg | INTRAVENOUS | Status: DC | PRN

## 2020-12-25 MED ORDER — METFORMIN HCL 500 MG PO TABS
500.0000 mg | ORAL_TABLET | Freq: Two times a day (BID) | ORAL | Status: DC
  Administered 2020-12-25 – 2020-12-26 (×2): 500 mg via ORAL
  Filled 2020-12-25 (×2): qty 1

## 2020-12-25 MED ORDER — FLEET ENEMA 7-19 GM/118ML RE ENEM: 1.0000 | ENEMA | Freq: Once | RECTAL | Status: DC | PRN

## 2020-12-25 MED ORDER — HYDROCODONE-ACETAMINOPHEN 7.5-325 MG PO TABS
1.0000 | ORAL_TABLET | ORAL | Status: DC | PRN
  Administered 2020-12-25 – 2020-12-26 (×4): 2 via ORAL
  Filled 2020-12-25 (×4): qty 2

## 2020-12-25 MED ORDER — PHENYLEPHRINE 40 MCG/ML (10ML) SYRINGE FOR IV PUSH (FOR BLOOD PRESSURE SUPPORT)
PREFILLED_SYRINGE | INTRAVENOUS | Status: DC | PRN
  Administered 2020-12-25 (×3): 80 ug via INTRAVENOUS
  Administered 2020-12-25: 40 ug via INTRAVENOUS
  Administered 2020-12-25: 80 ug via INTRAVENOUS

## 2020-12-25 MED ORDER — SUGAMMADEX SODIUM 200 MG/2ML IV SOLN
INTRAVENOUS | Status: DC | PRN
  Administered 2020-12-25: 200 mg via INTRAVENOUS

## 2020-12-25 MED ORDER — POVIDONE-IODINE 7.5 % EX SOLN
Freq: Once | CUTANEOUS | Status: DC
  Filled 2020-12-25: qty 118

## 2020-12-25 MED ORDER — ALBUMIN HUMAN 5 % IV SOLN: INTRAVENOUS | Status: DC | PRN

## 2020-12-25 MED ORDER — LACTATED RINGERS IV SOLN: INTRAVENOUS | Status: DC

## 2020-12-25 MED ORDER — HYDROXYZINE HCL 50 MG/ML IM SOLN: 50.0000 mg | Freq: Four times a day (QID) | INTRAMUSCULAR | Status: DC | PRN

## 2020-12-25 MED ORDER — CHLORHEXIDINE GLUCONATE 0.12 % MT SOLN
15.0000 mL | Freq: Once | OROMUCOSAL | Status: AC
  Administered 2020-12-25: 15 mL via OROMUCOSAL
  Filled 2020-12-25: qty 15

## 2020-12-25 MED ORDER — SUCCINYLCHOLINE CHLORIDE 200 MG/10ML IV SOSY
PREFILLED_SYRINGE | INTRAVENOUS | Status: DC | PRN
  Administered 2020-12-25: 140 mg via INTRAVENOUS

## 2020-12-25 MED ORDER — THROMBIN (RECOMBINANT) 20000 UNITS EX SOLR
CUTANEOUS | Status: AC
  Filled 2020-12-25: qty 20000

## 2020-12-25 MED ORDER — ROCURONIUM BROMIDE 10 MG/ML (PF) SYRINGE
PREFILLED_SYRINGE | INTRAVENOUS | Status: AC
  Filled 2020-12-25: qty 10

## 2020-12-25 MED ORDER — SENNOSIDES-DOCUSATE SODIUM 8.6-50 MG PO TABS: 1.0000 | ORAL_TABLET | Freq: Every evening | ORAL | Status: DC | PRN

## 2020-12-25 MED ORDER — METHOCARBAMOL 500 MG PO TABS
500.0000 mg | ORAL_TABLET | Freq: Four times a day (QID) | ORAL | Status: DC | PRN
  Administered 2020-12-25 – 2020-12-26 (×3): 500 mg via ORAL
  Filled 2020-12-25 (×2): qty 1

## 2020-12-25 MED ORDER — SODIUM CHLORIDE 0.9% FLUSH: 3.0000 mL | INTRAVENOUS | Status: DC | PRN

## 2020-12-25 MED ORDER — PHENYLEPHRINE HCL-NACL 10-0.9 MG/250ML-% IV SOLN
INTRAVENOUS | Status: DC | PRN
  Administered 2020-12-25: 100 ug/min via INTRAVENOUS
  Administered 2020-12-25: 70 ug/min via INTRAVENOUS

## 2020-12-25 MED ORDER — OXYCODONE HCL 5 MG PO TABS
5.0000 mg | ORAL_TABLET | Freq: Once | ORAL | Status: DC | PRN
Start: 2020-12-25 — End: 2020-12-25

## 2020-12-25 MED ORDER — HYDROMORPHONE HCL 1 MG/ML IJ SOLN
INTRAMUSCULAR | Status: AC
  Filled 2020-12-25: qty 1

## 2020-12-25 MED ORDER — LACTATED RINGERS IV SOLN: INTRAVENOUS | Status: DC | PRN

## 2020-12-25 MED ORDER — PROPRANOLOL HCL ER 80 MG PO CP24
160.0000 mg | ORAL_CAPSULE | Freq: Every day | ORAL | Status: DC
  Administered 2020-12-26: 160 mg via ORAL
  Filled 2020-12-25: qty 2
  Filled 2020-12-25: qty 1

## 2020-12-25 MED ORDER — ROCURONIUM BROMIDE 10 MG/ML (PF) SYRINGE
PREFILLED_SYRINGE | INTRAVENOUS | Status: DC | PRN
  Administered 2020-12-25: 20 mg via INTRAVENOUS
  Administered 2020-12-25: 60 mg via INTRAVENOUS
  Administered 2020-12-25 (×4): 20 mg via INTRAVENOUS

## 2020-12-25 MED ORDER — THROMBIN 20000 UNITS EX SOLR
CUTANEOUS | Status: DC | PRN
  Administered 2020-12-25: 20000 [IU] via TOPICAL

## 2020-12-25 MED ORDER — SODIUM CHLORIDE 0.9 % IV SOLN
250.0000 mL | INTRAVENOUS | Status: DC
  Administered 2020-12-25: 250 mL via INTRAVENOUS

## 2020-12-25 MED ORDER — BUPIVACAINE LIPOSOME 1.3 % IJ SUSP
INTRAMUSCULAR | Status: AC
  Filled 2020-12-25: qty 20

## 2020-12-25 MED ORDER — ONDANSETRON HCL 4 MG/2ML IJ SOLN
4.0000 mg | Freq: Four times a day (QID) | INTRAMUSCULAR | Status: AC | PRN
Start: 2020-12-25 — End: 2020-12-25
  Administered 2020-12-25: 4 mg via INTRAVENOUS

## 2020-12-25 MED ORDER — BUPIVACAINE-EPINEPHRINE 0.25% -1:200000 IJ SOLN
INTRAMUSCULAR | Status: DC | PRN
  Administered 2020-12-25: 20 mL
  Administered 2020-12-25: 10 mL

## 2020-12-25 MED ORDER — BUPIVACAINE LIPOSOME 1.3 % IJ SUSP
INTRAMUSCULAR | Status: DC | PRN
  Administered 2020-12-25: 20 mL

## 2020-12-25 MED ORDER — BUPIVACAINE-EPINEPHRINE (PF) 0.25% -1:200000 IJ SOLN
INTRAMUSCULAR | Status: AC
  Filled 2020-12-25: qty 30

## 2020-12-25 MED ORDER — LIDOCAINE 2% (20 MG/ML) 5 ML SYRINGE
INTRAMUSCULAR | Status: DC | PRN
  Administered 2020-12-25: 60 mg via INTRAVENOUS

## 2020-12-25 MED ORDER — CEFAZOLIN SODIUM-DEXTROSE 2-4 GM/100ML-% IV SOLN
2.0000 g | Freq: Three times a day (TID) | INTRAVENOUS | Status: AC
Start: 2020-12-25 — End: 2020-12-26
  Administered 2020-12-25 – 2020-12-26 (×2): 2 g via INTRAVENOUS
  Filled 2020-12-25 (×2): qty 100

## 2020-12-25 MED ORDER — MELATONIN 5 MG PO TABS: 10.0000 mg | ORAL_TABLET | Freq: Every evening | ORAL | Status: DC | PRN

## 2020-12-25 MED ORDER — ZOLPIDEM TARTRATE 5 MG PO TABS: 5.0000 mg | ORAL_TABLET | Freq: Every evening | ORAL | Status: DC | PRN

## 2020-12-25 MED ORDER — POTASSIUM CHLORIDE IN NACL 20-0.9 MEQ/L-% IV SOLN: INTRAVENOUS | Status: DC

## 2020-12-25 MED ORDER — OXYCODONE HCL 5 MG/5ML PO SOLN: 5.0000 mg | Freq: Once | ORAL | Status: DC | PRN

## 2020-12-25 MED ORDER — LIDOCAINE 2% (20 MG/ML) 5 ML SYRINGE
INTRAMUSCULAR | Status: AC
  Filled 2020-12-25: qty 5

## 2020-12-25 MED ORDER — PANTOPRAZOLE SODIUM 40 MG PO TBEC
40.0000 mg | DELAYED_RELEASE_TABLET | Freq: Every day | ORAL | Status: DC
  Administered 2020-12-26: 40 mg via ORAL
  Filled 2020-12-25: qty 1

## 2020-12-25 MED ORDER — MIDAZOLAM HCL 2 MG/2ML IJ SOLN
INTRAMUSCULAR | Status: AC
  Filled 2020-12-25: qty 2

## 2020-12-25 MED ORDER — PROPOFOL 500 MG/50ML IV EMUL
INTRAVENOUS | Status: DC | PRN
  Administered 2020-12-25: 25 ug/kg/min via INTRAVENOUS
  Administered 2020-12-25: 45 ug/kg/min via INTRAVENOUS

## 2020-12-25 MED ORDER — 0.9 % SODIUM CHLORIDE (POUR BTL) OPTIME
TOPICAL | Status: DC | PRN
Start: 2020-12-25 — End: 2020-12-25
  Administered 2020-12-25 (×2): 1000 mL

## 2020-12-25 MED ORDER — PHENYLEPHRINE 40 MCG/ML (10ML) SYRINGE FOR IV PUSH (FOR BLOOD PRESSURE SUPPORT)
PREFILLED_SYRINGE | INTRAVENOUS | Status: AC
  Filled 2020-12-25: qty 10

## 2020-12-25 MED ORDER — SODIUM CHLORIDE 0.9% FLUSH
3.0000 mL | Freq: Two times a day (BID) | INTRAVENOUS | Status: DC
  Administered 2020-12-25: 3 mL via INTRAVENOUS

## 2020-12-25 MED ORDER — CEFAZOLIN SODIUM-DEXTROSE 2-4 GM/100ML-% IV SOLN
INTRAVENOUS | Status: AC
  Filled 2020-12-25: qty 100

## 2020-12-25 MED ORDER — PHENOL 1.4 % MT LIQD: 1.0000 | OROMUCOSAL | Status: DC | PRN

## 2020-12-25 MED ORDER — METHYLENE BLUE 0.5 % INJ SOLN
INTRAVENOUS | Status: AC
  Filled 2020-12-25: qty 10

## 2020-12-25 MED ORDER — PHENYLEPHRINE HCL-NACL 10-0.9 MG/250ML-% IV SOLN
INTRAVENOUS | Status: AC
  Filled 2020-12-25: qty 250

## 2020-12-25 MED ORDER — PROPOFOL 10 MG/ML IV BOLUS
INTRAVENOUS | Status: DC | PRN
  Administered 2020-12-25: 200 mg via INTRAVENOUS

## 2020-12-25 MED ORDER — ORAL CARE MOUTH RINSE: 15.0000 mL | Freq: Once | OROMUCOSAL | Status: AC

## 2020-12-25 MED ORDER — ALUM & MAG HYDROXIDE-SIMETH 200-200-20 MG/5ML PO SUSP: 30.0000 mL | Freq: Four times a day (QID) | ORAL | Status: DC | PRN

## 2020-12-25 MED ORDER — CEFAZOLIN SODIUM-DEXTROSE 2-4 GM/100ML-% IV SOLN
2.0000 g | INTRAVENOUS | Status: AC
  Administered 2020-12-25 (×2): 2 g via INTRAVENOUS
  Filled 2020-12-25: qty 100

## 2020-12-25 MED ORDER — METHYLENE BLUE 0.5 % INJ SOLN
INTRAVENOUS | Status: DC | PRN
Start: 2020-12-25 — End: 2020-12-25
  Administered 2020-12-25: .03 mL

## 2020-12-25 MED ORDER — ONDANSETRON HCL 4 MG/2ML IJ SOLN
INTRAMUSCULAR | Status: DC | PRN
  Administered 2020-12-25: 4 mg via INTRAVENOUS

## 2020-12-25 MED ORDER — DOCUSATE SODIUM 100 MG PO CAPS
100.0000 mg | ORAL_CAPSULE | Freq: Two times a day (BID) | ORAL | Status: DC
  Administered 2020-12-25 – 2020-12-26 (×2): 100 mg via ORAL
  Filled 2020-12-25 (×2): qty 1

## 2020-12-25 SURGICAL SUPPLY — 88 items
BENZOIN TINCTURE PRP APPL 2/3 (GAUZE/BANDAGES/DRESSINGS) ×2 IMPLANT
BLADE CLIPPER SURG (BLADE) ×2 IMPLANT
BONE VIVIGEN FORMABLE 10CC (Bone Implant) ×2 IMPLANT
BUR PRESCISION 1.7 ELITE (BURR) ×2 IMPLANT
BUR ROUND FLUTED 5 RND (BURR) ×2 IMPLANT
BUR ROUND PRECISION 4.0 (BURR) IMPLANT
BUR SABER RD CUTTING 3.0 (BURR) IMPLANT
CAGE SABLE 10X30 9-16 8D (Cage) ×2 IMPLANT
CARTRIDGE OIL MAESTRO DRILL (MISCELLANEOUS) ×1 IMPLANT
CLSR STERI-STRIP ANTIMIC 1/2X4 (GAUZE/BANDAGES/DRESSINGS) ×2 IMPLANT
CNTNR URN SCR LID CUP LEK RST (MISCELLANEOUS) ×1 IMPLANT
CONT SPEC 4OZ STRL OR WHT (MISCELLANEOUS) ×1
COVER BACK TABLE 60X90IN (DRAPES) ×2 IMPLANT
COVER MAYO STAND STRL (DRAPES) ×4 IMPLANT
COVER SURGICAL LIGHT HANDLE (MISCELLANEOUS) ×2 IMPLANT
COVER WAND RF STERILE (DRAPES) ×2 IMPLANT
DIFFUSER DRILL AIR PNEUMATIC (MISCELLANEOUS) ×2 IMPLANT
DRAIN CHANNEL 15F RND FF W/TCR (WOUND CARE) IMPLANT
DRAPE C-ARM 42X72 X-RAY (DRAPES) ×2 IMPLANT
DRAPE C-ARMOR (DRAPES) IMPLANT
DRAPE POUCH INSTRU U-SHP 10X18 (DRAPES) ×2 IMPLANT
DRAPE SURG 17X23 STRL (DRAPES) ×8 IMPLANT
DURAPREP 26ML APPLICATOR (WOUND CARE) ×4 IMPLANT
ELECT BLADE 4.0 EZ CLEAN MEGAD (MISCELLANEOUS) ×2
ELECT CAUTERY BLADE 6.4 (BLADE) ×2 IMPLANT
ELECT REM PT RETURN 9FT ADLT (ELECTROSURGICAL) ×2
ELECTRODE BLDE 4.0 EZ CLN MEGD (MISCELLANEOUS) ×1 IMPLANT
ELECTRODE REM PT RTRN 9FT ADLT (ELECTROSURGICAL) ×1 IMPLANT
EVACUATOR SILICONE 100CC (DRAIN) IMPLANT
FEE INTRAOP MONITOR IMPULS NCS (MISCELLANEOUS) ×1 IMPLANT
FILTER STRAW FLUID ASPIR (MISCELLANEOUS) ×2 IMPLANT
GAUZE 4X4 16PLY RFD (DISPOSABLE) ×2 IMPLANT
GAUZE SPONGE 4X4 12PLY STRL (GAUZE/BANDAGES/DRESSINGS) ×2 IMPLANT
GLOVE BIO SURGEON STRL SZ7 (GLOVE) ×2 IMPLANT
GLOVE BIO SURGEON STRL SZ8 (GLOVE) ×2 IMPLANT
GLOVE SRG 8 PF TXTR STRL LF DI (GLOVE) ×1 IMPLANT
GLOVE SURG UNDER POLY LF SZ7 (GLOVE) ×2 IMPLANT
GLOVE SURG UNDER POLY LF SZ8 (GLOVE) ×1
GOWN STRL REUS W/ TWL LRG LVL3 (GOWN DISPOSABLE) ×2 IMPLANT
GOWN STRL REUS W/ TWL XL LVL3 (GOWN DISPOSABLE) ×1 IMPLANT
GOWN STRL REUS W/TWL LRG LVL3 (GOWN DISPOSABLE) ×2
GOWN STRL REUS W/TWL XL LVL3 (GOWN DISPOSABLE) ×1
INTRAOP MONITOR FEE IMPULS NCS (MISCELLANEOUS) ×1
INTRAOP MONITOR FEE IMPULSE (MISCELLANEOUS) ×1
IV CATH 14GX2 1/4 (CATHETERS) ×2 IMPLANT
KIT BASIN OR (CUSTOM PROCEDURE TRAY) ×2 IMPLANT
KIT POSITION SURG JACKSON T1 (MISCELLANEOUS) ×2 IMPLANT
KIT TURNOVER KIT B (KITS) ×2 IMPLANT
MARKER SKIN DUAL TIP RULER LAB (MISCELLANEOUS) ×8 IMPLANT
NEEDLE 18GX1X1/2 (RX/OR ONLY) (NEEDLE) ×2 IMPLANT
NEEDLE 22X1 1/2 (OR ONLY) (NEEDLE) ×4 IMPLANT
NEEDLE HYPO 25GX1X1/2 BEV (NEEDLE) ×2 IMPLANT
NEEDLE SPNL 18GX3.5 QUINCKE PK (NEEDLE) ×4 IMPLANT
NS IRRIG 1000ML POUR BTL (IV SOLUTION) ×2 IMPLANT
OIL CARTRIDGE MAESTRO DRILL (MISCELLANEOUS) ×2
PACK LAMINECTOMY ORTHO (CUSTOM PROCEDURE TRAY) ×2 IMPLANT
PACK UNIVERSAL I (CUSTOM PROCEDURE TRAY) ×2 IMPLANT
PAD ARMBOARD 7.5X6 YLW CONV (MISCELLANEOUS) ×4 IMPLANT
PATTIES SURGICAL .5 X1 (DISPOSABLE) ×2 IMPLANT
PATTIES SURGICAL .5X1.5 (GAUZE/BANDAGES/DRESSINGS) ×2 IMPLANT
ROD PRE BENT EXPEDIUM 35MM (Rod) ×4 IMPLANT
SCREW SET SINGLE INNER (Screw) ×8 IMPLANT
SCREW VIPER CORT FIX 6.00X30 (Screw) ×8 IMPLANT
SOL ANTI FOG 6CC (MISCELLANEOUS) ×1 IMPLANT
SOLUTION ANTI FOG 6CC (MISCELLANEOUS) ×1
SPONGE INTESTINAL PEANUT (DISPOSABLE) ×4 IMPLANT
SPONGE LAP 4X18 RFD (DISPOSABLE) ×2 IMPLANT
SPONGE SURGIFOAM ABS GEL 100 (HEMOSTASIS) ×2 IMPLANT
STRIP CLOSURE SKIN 1/2X4 (GAUZE/BANDAGES/DRESSINGS) ×4 IMPLANT
SURGIFLO W/THROMBIN 8M KIT (HEMOSTASIS) IMPLANT
SUT BONE WAX W31G (SUTURE) ×4 IMPLANT
SUT MNCRL AB 4-0 PS2 18 (SUTURE) ×2 IMPLANT
SUT VIC AB 0 CT1 18XCR BRD 8 (SUTURE) ×1 IMPLANT
SUT VIC AB 0 CT1 8-18 (SUTURE) ×1
SUT VIC AB 1 CT1 18XCR BRD 8 (SUTURE) ×1 IMPLANT
SUT VIC AB 1 CT1 8-18 (SUTURE) ×1
SUT VIC AB 2-0 CT2 18 VCP726D (SUTURE) ×2 IMPLANT
SYR 20ML LL LF (SYRINGE) ×4 IMPLANT
SYR BULB IRRIG 60ML STRL (SYRINGE) ×2 IMPLANT
SYR CONTROL 10ML LL (SYRINGE) ×4 IMPLANT
SYR TB 1ML LUER SLIP (SYRINGE) ×2 IMPLANT
TAP EXPEDIUM DL 4.35 (INSTRUMENTS) ×2 IMPLANT
TAP EXPEDIUM DL 5.0 (INSTRUMENTS) ×2 IMPLANT
TAP EXPEDIUM DL 6.0 (INSTRUMENTS) ×2 IMPLANT
TAPE CLOTH SURG 6X10 WHT LF (GAUZE/BANDAGES/DRESSINGS) ×2 IMPLANT
TRAY FOLEY MTR SLVR 16FR STAT (SET/KITS/TRAYS/PACK) ×2 IMPLANT
WATER STERILE IRR 1000ML POUR (IV SOLUTION) ×2 IMPLANT
YANKAUER SUCT BULB TIP NO VENT (SUCTIONS) ×2 IMPLANT

## 2020-12-25 NOTE — Anesthesia Procedure Notes (Addendum)
Procedure Name: Intubation Date/Time: 12/25/2020 8:38 AM Performed by: Michele Rockers, CRNA Pre-anesthesia Checklist: Patient identified, Patient being monitored, Timeout performed, Emergency Drugs available and Suction available Patient Re-evaluated:Patient Re-evaluated prior to induction Oxygen Delivery Method: Circle System Utilized Preoxygenation: Pre-oxygenation with 100% oxygen Induction Type: IV induction and Rapid sequence Laryngoscope Size: Mac, 3 and Glidescope Grade View: Grade I Tube type: Oral Tube size: 8.0 mm Number of attempts: 1 Airway Equipment and Method: Stylet Placement Confirmation: ETT inserted through vocal cords under direct vision,  positive ETCO2 and breath sounds checked- equal and bilateral Secured at: 22 cm Tube secured with: Tape Dental Injury: Teeth and Oropharynx as per pre-operative assessment  Difficulty Due To: Difficult Airway- due to large tongue, Difficult Airway- due to limited oral opening and Difficult Airway- due to dentition

## 2020-12-25 NOTE — Transfer of Care (Addendum)
Immediate Anesthesia Transfer of Care Note  Patient: Brandon Marshall  Procedure(s) Performed: LEFT-SIDED LUMBAR 4 - LUMBAR 5 TRANSFORAMINAL LUMBAR INTERBODY FUSION WITH INSTRUMENTATION AND ALLOGRAFT (Left )  Patient Location: PACU  Anesthesia Type:General  Level of Consciousness: awake, patient cooperative and responds to stimulation  Airway & Oxygen Therapy: Patient Spontanous Breathing and Patient connected to face mask oxygen  Post-op Assessment: Report given to RN, Post -op Vital signs reviewed and stable and Patient moving all extremities X 4  Post vital signs: Reviewed and stable  Last Vitals:  Vitals Value Taken Time  BP 129/67 12/25/20 1439  Temp    Pulse 79 12/25/20 1442  Resp 9 12/25/20 1442  SpO2 100 % 12/25/20 1442  Vitals shown include unvalidated device data.  Last Pain:  Vitals:   12/25/20 0709  TempSrc:   PainSc: 3       Patients Stated Pain Goal: 3 (95/28/41 3244)  Complications: No complications documented.

## 2020-12-25 NOTE — Anesthesia Preprocedure Evaluation (Signed)
Anesthesia Evaluation  Patient identified by MRN, date of birth, ID band Patient awake    Reviewed: Allergy & Precautions, H&P , NPO status , Patient's Chart, lab work & pertinent test results  Airway Mallampati: II   Neck ROM: full    Dental   Pulmonary neg pulmonary ROS,    breath sounds clear to auscultation       Cardiovascular hypertension,  Rhythm:regular Rate:Normal     Neuro/Psych    GI/Hepatic GERD  ,  Endo/Other  diabetesobese  Renal/GU      Musculoskeletal   Abdominal   Peds  Hematology   Anesthesia Other Findings   Reproductive/Obstetrics                             Anesthesia Physical Anesthesia Plan  ASA: II  Anesthesia Plan: General   Post-op Pain Management:    Induction: Intravenous  PONV Risk Score and Plan: 2 and Ondansetron, Dexamethasone, Midazolam and Treatment may vary due to age or medical condition  Airway Management Planned: Oral ETT  Additional Equipment:   Intra-op Plan:   Post-operative Plan: Extubation in OR  Informed Consent: I have reviewed the patients History and Physical, chart, labs and discussed the procedure including the risks, benefits and alternatives for the proposed anesthesia with the patient or authorized representative who has indicated his/her understanding and acceptance.     Dental advisory given  Plan Discussed with: CRNA, Anesthesiologist and Surgeon  Anesthesia Plan Comments:         Anesthesia Quick Evaluation

## 2020-12-25 NOTE — H&P (Signed)
PREOPERATIVE H&P  Chief Complaint: Left leg pain  HPI: Brandon Marshall is a 50 y.o. male who presents with ongoing pain in the left leg. Patient is s/p a previous decompression, which he did well from, and had a recurrence of pain 3 months after surgery.  Updated MRI reveals a recurrent left L4/5 HNP  Patient has failed multiple forms of conservative care and continues to have pain (see office notes for additional details regarding the patient's full course of treatment)  Past Medical History:  Diagnosis Date  . Diabetes mellitus without complication ([place])   . Elevated liver enzymes   . GERD (gastroesophageal reflux disease)   . Hypertension 2012  . Pneumonia    Past Surgical History:  Procedure Laterality Date  . BACK SURGERY    . CARDIAC CATHETERIZATION  2012  . COLONOSCOPY WITH PROPOFOL N/A 10/30/2020   Procedure: COLONOSCOPY WITH PROPOFOL;  Surgeon: Toledo, Benay Pike, MD;  Location: [facility] ENDOSCOPY;  Service: Gastroenterology;  Laterality: N/A;  . LUMBAR LAMINECTOMY/DECOMPRESSION MICRODISCECTOMY Left 07/25/2020   Procedure: LEFT-SIDED LUMBAR 4 - LUMBAR 5 MICRODISECTOMY;  Surgeon: Phylliss Bob, MD;  Location: [facility];  Service: Orthopedics;  Laterality: Left;   Social History   Socioeconomic History  . Marital status: Married    Spouse name: Not on file  . Number of children: Not on file  . Years of education: Not on file  . Highest education level: Not on file  Occupational History  . Not on file  Tobacco Use  . Smoking status: Never Smoker  . Smokeless tobacco: Never Used  Vaping Use  . Vaping Use: Never used  Substance and Sexual Activity  . Alcohol use: No  . Drug use: No  . Sexual activity: Not on file  Other Topics Concern  . Not on file  Social History Narrative  . Not on file   Social Determinants of Health   Financial Resource Strain: Not on file  Food Insecurity: Not on file  Transportation Needs: Not on file  Physical Activity: Not on file   Stress: Not on file  Social Connections: Not on file   Family History  Problem Relation Age of Onset  . Stomach cancer Father 80   Allergies  Allergen Reactions  . Ibuprofen     Avoid due to liver issues  . Neuromuscular Blocking Agents     Hyperglycemia   . Tylenol [Acetaminophen]     Avoid due to liver issues   Prior to Admission medications   Medication Sig Start Date End Date Taking? Authorizing Provider  aspirin-acetaminophen-caffeine (EXCEDRIN MIGRAINE) [phone] MG tablet Take 2 tablets by mouth every 6 (six) hours as needed for headache.   Yes [provider]  CINNAMON PO Take 1,000 mg by mouth daily.   Yes [provider]  Melatonin 10 MG CAPS Take 10 mg by mouth at bedtime as needed (sleep).   Yes [provider]  metFORMIN (GLUCOPHAGE) 500 MG tablet Take 500 mg by mouth 2 (two) times daily. 11/29/20  Yes [provider]  Multiple Vitamins-Minerals (EMERGEN-C IMMUNE PO) Take 2 capsules by mouth daily.   Yes [provider]  pantoprazole (PROTONIX) 40 MG tablet Take 40 mg by mouth daily.   Yes [provider]  propranolol ER (INDERAL LA) 160 MG SR capsule Take 160 mg by mouth daily. 06/19/20  Yes [provider]     All other systems have been reviewed and were otherwise negative with the exception of those mentioned  in the HPI and as above.  Physical Exam: Vitals:   12/25/20 0639  BP: 138/76  Pulse: 70  Resp: 18  Temp: 98.2 F (36.8 C)  SpO2: 97%    Body mass index is 38.4 kg/m.  General: Alert, no acute distress Cardiovascular: No pedal edema Respiratory: No cyanosis, no use of accessory musculature Skin: No lesions in the area of chief complaint Neurologic: Sensation intact distally Psychiatric: Patient is competent for consent with normal mood and affect Lymphatic: No axillary or cervical lymphadenopathy  MUSCULOSKELETAL: + SLR on the left  Assessment/Plan: LEFT LUMBAR 5 RADICULOPATHY  SECONDARY TO A RECURRENT  LARGE LEFT-SIDED LUMBAR 4 - LUMBAR 5 [place] for Procedure(s): LEFT-SIDED LUMBAR 4 - LUMBAR 5 TRANSFORAMINAL LUMBAR INTERBODY FUSION WITH INSTRUMENTATION AND ALLOGRAFT   Norva Karvonen, MD 12/25/2020 8:05 AM

## 2020-12-25 NOTE — Op Note (Signed)
PATIENT NAME: Brandon Marshall   MEDICAL RECORD NO.:   [id number]    DATE OF BIRTH: Mar 06, 1971   DATE OF PROCEDURE: 12/25/2020                               OPERATIVE REPORT     PREOPERATIVE DIAGNOSES: 1. Recurrent left -sided lumbar radiculopathy. 2. Recurrent left L4/5 HNP 3. S/p previous L4/5 decompression   POSTOPERATIVE DIAGNOSES: 1. Recurrent left -sided lumbar radiculopathy. 2. Recurrent left L4/5 HNP 3. S/p previous L4/5 decompression   PROCEDURES: 1. Complex revision L4-5 decompression. 2. Complex left-sided L4-5 transforaminal lumbar interbody fusion. 3. Right-sided L4-5 posterolateral fusion. 4. Insertion of interbody device x1 (Globus expandable intervertebral spacer). 5. Placement of posterior instrumentation L4, L5. 6. Use of local autograft. 7. Use of morselized allograft - ViviGen. 8. Intraoperative use of fluoroscopy.   SURGEON:  Phylliss Bob, MD.   ASSISTANTPricilla Holm, PA-C.   ANESTHESIA:  General endotracheal anesthesia.   COMPLICATIONS:  None.   DISPOSITION:  Stable.   ESTIMATED BLOOD LOSS:  200cc   INDICATIONS FOR SURGERY:  Briefly, Brandon Marshall is a pleasant 50 year old male, who did present to me with severe pain in the left leg.   Of note, he is status post a previous decompression, from which he did very well from.  He did however report a recurrence of pain.  An updated MRI did reveal recurrent large disc herniation, with severe compression of the left L5 nerve.  I did feel that the symptoms  were secondary to the findings noted above.   The patient failed conservative care and did wish to proceed with the procedure  noted above.   OPERATIVE DETAILS:  On 12/25/2020, the patient was brought to surgery and general endotracheal anesthesia was administered.  The patient was placed prone on a well-padded flat Jackson bed with a spinal frame.  Antibiotics were given and a time-out procedure was performed. The back was prepped and draped in the usual  fashion.  A midline incision was made overlying the L4-5 intervertebral space, in line with  the patient's previous incision.  The fascia was incised at the midline.  The paraspinal musculature was bluntly swept laterally.  Anatomic landmarks for the pedicles were exposed. Using fluoroscopy, I did cannulate the L4 and L5 pedicles bilaterally, using a medial to lateral cortical trajectory technique.  On the right side, the posterolateral gutter and right facet joint at L4-5 was decorticated and 6 mm screws of the appropriate length were placed  and a 40-mm rod was placed and distraction was applied across the rod on the right side.  On the left side, the cannulated pedicle holes were filled with bone wax.  I then proceeded with the decompressive aspect of the procedure.     Of note, this was an extremely meticulous portion of the procedure. Typically, it would take approximately 15 to 20 additional minutes to identify the intervertebral disc space. However, in this revision situation, there was a very extensive scar tissue and adhesions identified circumferentially around the dura, traversing left L5 nerve, and the disc herniation.  Safely identifying the intervertebral disc space took approximately 90 minutes do to the extensive scar and adhesions.  Developing a plane between the traversing left L5 nerve and the intervertebral disc herniation was also extremely meticulous and time-consuming, I was however able to develop a safe plane and medially retract the nerve, at which point I proceeded with  the decompression.  Multiple disc fragments were removed after annulotomy was performed.  This did result in partial decompression of the left L5 nerve.  There were additional fragments very much adherent to the nerve itself, which were very meticulously teased away and removed, thereby entirely decompressing the left L5 nerve. Next, with an assistant holding medial retraction of the traversing left L5 nerve,  I used a series of curettes and pituitary rongeurs to perform a thorough and complete intervertebral diskectomy.  The intervertebral space was then liberally packed with autograft as well as allograft in the form of ViviGen, as was the appropriate-sized intervertebral spacer.  The spacer was then tamped into position in the usual fashion.    I then expanded the spacer up to approximately 13 mm. I was very pleased with the press-fit of the spacer.  I then placed 6 mm screws on the left at L4 and L5.  A 40-mm rod was then placed and caps were placed. The distraction was then released on the contralateral right side.  All 4 caps were then locked.  The wound was copiously irrigated with a total of approximately 3 L prior to placing the bone graft.  Additional autograft and allograft were then packed into the posterolateral gutter on the right side to help aid in the success of the fusion.  The wound was  explored for any undue bleeding and there was no substantial bleeding encountered.  Gel-Foam was placed over the laminectomy site.  The wound was then closed in layers using #1 Vicryl followed by 2-0 Vicryl, followed by 4-0 Monocryl.  Benzoin and Steri-Strips were applied followed by sterile dressing.   There was no sustained abnormal EMG activity noted throughout the surgery, nor was there any change in SSEPs..   Of note, Pricilla Holm was my assistant throughout surgery, and did aid in retraction, suctioning, and placement of the hardware.     Phylliss Bob, MD

## 2020-12-26 LAB — GLUCOSE, CAPILLARY: Glucose-Capillary: 129 mg/dL — ABNORMAL HIGH (ref 70–99)

## 2020-12-26 MED ORDER — METHOCARBAMOL 500 MG PO TABS
500.0000 mg | ORAL_TABLET | Freq: Four times a day (QID) | ORAL | 1 refills | Status: AC | PRN
Start: 2020-12-26 — End: ?

## 2020-12-26 MED ORDER — DOCUSATE SODIUM 100 MG PO CAPS: 100.0000 mg | ORAL_CAPSULE | Freq: Two times a day (BID) | ORAL | 2 refills | Status: AC

## 2020-12-26 MED ORDER — HYDROCODONE-ACETAMINOPHEN 7.5-325 MG PO TABS: 1.0000 | ORAL_TABLET | ORAL | 0 refills | Status: AC | PRN

## 2020-12-26 MED ORDER — PHENYLEPHRINE HCL-NACL 10-0.9 MG/250ML-% IV SOLN
INTRAVENOUS | Status: AC
  Filled 2020-12-26: qty 250

## 2020-12-26 MED FILL — Thrombin (Recombinant) For Soln 20000 Unit: CUTANEOUS | Qty: 1 | Status: AC

## 2020-12-26 NOTE — Evaluation (Signed)
Occupational Therapy Evaluation and Discharge Summary Patient Details Name: Brandon Marshall MRN: [id number] DOB: Jan 06, 1971 Today's Date: 12/26/2020    History of Present Illness Pt is a 50 y/o male who presents s/p L4-L5 TLIF on 12/25/2020. PMH significant for PNA, HTN, DM, prior back surgery in 2021.   Clinical Impression   Pt admitted with the above diagnosis and has the deficits listed below. Pt had previous surgery in November 2021 and is familiar with all precautions.  Reviewed them again as unsure if pt is good about following them. Pt only needs assist with donning socks and shoes at this point and will have wife or children assist with this at home. Pt does not have further OT needs at this time.  Recommend increased supervision at home the first few days after d/c.       Follow Up Recommendations  Supervision - Intermittent    Equipment Recommendations  3 in 1 bedside commode    Recommendations for Other Services       Precautions / Restrictions Precautions Precautions: Fall;Back Precaution Booklet Issued: Yes (comment) Precaution Comments: Reviewed all precautions with pt. Required Braces or Orthoses: Spinal Brace Spinal Brace: Thoracolumbosacral orthotic Restrictions Weight Bearing Restrictions: No      Mobility Bed Mobility Overal bed mobility: Needs Assistance Bed Mobility: Rolling;Sidelying to Sit Rolling: Supervision Sidelying to sit: Supervision       General bed mobility comments: Pt elevating HOB despite cues to try getting up from a flat bed to simulate home environment. Pt reports he nailed a 2x4 to his bed at home to use as a "bedrail"    Transfers Overall transfer level: Modified independent Equipment used: None Transfers: Sit to/from Stand           General transfer comment: Good hand placement on seated surface for safety. No assist required.    Balance Overall balance assessment: Mild deficits observed, not formally tested                                          ADL either performed or assessed with clinical judgement   ADL Overall ADL's : Needs assistance/impaired Eating/Feeding: Independent   Grooming: Wash/dry hands;Wash/dry face;Oral care;Set up;Standing   Upper Body Bathing: Set up;Sitting Upper Body Bathing Details (indicate cue type and reason): pt familiar with brace and can donn without assist. Lower Body Bathing: Minimal assistance;Sit to/from stand   Upper Body Dressing : Set up;Sitting   Lower Body Dressing: Moderate assistance;Sit to/from stand Lower Body Dressing Details (indicate cue type and reason): wife to assist with socks and shoes. Toilet Transfer: Supervision/safety;Ambulation;Comfort height toilet;Grab bars   Toileting- Clothing Manipulation and Hygiene: Supervision/safety;Sit to/from stand       Functional mobility during ADLs: Supervision/safety General ADL Comments: Pt doing well with adls oveall. Pt has difficulty getting to feet but wife will assist until pt moving better. Pt not interested in sock aid.     Vision Baseline Vision/History: No visual deficits Patient Visual Report: No change from baseline Vision Assessment?: No apparent visual deficits     Perception Perception Perception Tested?: No   Praxis Praxis Praxis tested?: Within functional limits    Pertinent Vitals/Pain Pain Assessment: 0-10 Pain Score: 7  Faces Pain Scale: Hurts little more Pain Location: back Pain Descriptors / Indicators: Operative site guarding;Aching Pain Intervention(s): Limited activity within patient's tolerance;Monitored during session;Repositioned     Hand Dominance  Right   Extremity/Trunk Assessment Upper Extremity Assessment Upper Extremity Assessment: Overall WFL for tasks assessed   Lower Extremity Assessment Lower Extremity Assessment: Defer to PT evaluation   Cervical / Trunk Assessment Cervical / Trunk Assessment: Other exceptions Cervical / Trunk  Exceptions: s/p surgery   Communication Communication Communication: No difficulties   Cognition Arousal/Alertness: Awake/alert Behavior During Therapy: WFL for tasks assessed/performed Overall Cognitive Status: Within Functional Limits for tasks assessed                                     General Comments  Pt slightly unsteady when first getting up.  Reviewed back precautions at length. Feel pt does not always follow precautions and with repeat surgery feel pt needs reinforcement on these precautions.    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 Problem List:        OT Treatment/Interventions:      OT Goals(Current goals can be found in the care plan section) Acute Rehab OT Goals Patient Stated Goal: Home today OT Goal Formulation: All assessment and education complete, DC therapy  OT Frequency:     Barriers to D/C:            Co-evaluation              AM-PAC OT "6 Clicks" Daily Activity     Outcome Measure Help from another person eating meals?: None Help from another person taking care of personal grooming?: None Help from another person toileting, which includes using toliet, bedpan, or urinal?: None Help from another person bathing (including washing, rinsing, drying)?: A Little Help from another person to put on and taking off regular upper body clothing?:  None Help from another person to put on and taking off regular lower body clothing?: A Little 6 Click Score: 22   End of Session Equipment Utilized During Treatment: Back brace Nurse Communication: Mobility status  Activity Tolerance: Patient tolerated treatment well Patient left: in chair;with call bell/phone within reach  OT Visit Diagnosis: Unsteadiness on feet (R26.81)                Time: 7654-6503 OT Time Calculation (min): 12 min Charges:  OT General Charges $OT Visit: 1 Visit OT Evaluation $OT Eval Low Complexity: 1 Low  Brandon Marshall 12/26/2020, 9:52 AM

## 2020-12-26 NOTE — Evaluation (Signed)
Physical Therapy Evaluation and Discharge  Patient Details Name: Brandon Marshall MRN: [id number] DOB: 06/26/1971 Today's Date: 12/26/2020   History of Present Illness  Pt is a 50 y/o male who presents s/p L4-L5 TLIF on 12/25/2020. PMH significant for PNA, HTN, DM, prior back surgery in 2021.    Clinical Impression  Patient evaluated by Physical Therapy with no further acute PT needs identified. All education has been completed and the patient has no further questions. Pt was able to demonstrate transfers and ambulation with gross modified independence to supervision for safety and no AD. Pt was educated on precautions, brace application/wearing schedule, appropriate activity progression, and car transfer, however familiar with precautions because of recent back surgery ~5 months ago. See below for any follow-up Physical Therapy or equipment needs. PT is signing off. Thank you for this referral.     Follow Up Recommendations No PT follow up;Supervision for mobility/OOB    Equipment Recommendations  None recommended by PT    Recommendations for Other Services       Precautions / Restrictions Precautions Precautions: Fall;Back Precaution Booklet Issued: Yes (comment) Precaution Comments: Reviewed precaution sheet and pt was cued for precautions during functional mobility. Required Braces or Orthoses: Spinal Brace Spinal Brace: Thoracolumbosacral orthotic;Applied in sitting position Restrictions Weight Bearing Restrictions: No      Mobility  Bed Mobility Overal bed mobility: Needs Assistance Bed Mobility: Rolling;Sidelying to Sit Rolling: Supervision Sidelying to sit: Supervision       General bed mobility comments: Pt elevating HOB despite cues to try getting up from a flat bed to simulate home environment. Pt reports he nailed a 2x4 to his bed at home to use as a "bedrail"    Transfers Overall transfer level: Modified independent Equipment used: None Transfers: Sit to/from  Stand           General transfer comment: Good hand placement on seated surface for safety. No assist required.  Ambulation/Gait Ambulation/Gait assistance: Supervision Gait Distance (Feet): 350 Feet Assistive device: None Gait Pattern/deviations: Step-through pattern;Decreased stride length;Trunk flexed Gait velocity: Decreased Gait velocity interpretation: <1.8 ft/sec, indicate of risk for recurrent falls General Gait Details: Slow but generally steady. Light supervision for safety.  Stairs Stairs: Yes Stairs assistance: Supervision Stair Management: One rail Right;Step to pattern;Forwards Number of Stairs: 2 General stair comments: VC's for general safety and sequencing. No assist required but supervision for safety.  Wheelchair Mobility    Modified Rankin (Stroke Patients Only)       Balance Overall balance assessment: Mild deficits observed, not formally tested                                           Pertinent Vitals/Pain Pain Assessment: Faces Faces Pain Scale: Hurts little more Pain Location: Incision site Pain Descriptors / Indicators: Operative site guarding;Aching Pain Intervention(s): Limited activity within patient's tolerance;Monitored during session;Repositioned    Home Living Family/patient expects to be discharged to:: Private residence Living Arrangements: Spouse/significant other;Children Available Help at Discharge: Family;Available 24 hours/day Type of Home: House Home Access: Stairs to enter   [employer] of Steps: 2 Home Layout: One level Home Equipment: None      Prior Function Level of Independence: Independent               Hand Dominance        Extremity/Trunk Assessment   Upper Extremity Assessment Upper Extremity  Assessment: Defer to OT evaluation    Lower Extremity Assessment Lower Extremity Assessment: Overall WFL for tasks assessed (Mild weakness consistent with pre-op diagnosis)     Cervical / Trunk Assessment Cervical / Trunk Assessment: Other exceptions Cervical / Trunk Exceptions: s/p surgery  Communication   Communication: No difficulties  Cognition Arousal/Alertness: Awake/alert Behavior During Therapy: WFL for tasks assessed/performed Overall Cognitive Status: Within Functional Limits for tasks assessed                                        General Comments      Exercises     Assessment/Plan    PT Assessment Patent does not need any further PT services  PT Problem List         PT Treatment Interventions      PT Goals (Current goals can be found in the Care Plan section)  Acute Rehab PT Goals Patient Stated Goal: Home today PT Goal Formulation: All assessment and education complete, DC therapy    Frequency     Barriers to discharge        Co-evaluation               AM-PAC PT "6 Clicks" Mobility  Outcome Measure Help needed turning from your back to your side while in a flat bed without using bedrails?: None Help needed moving from lying on your back to sitting on the side of a flat bed without using bedrails?: None Help needed moving to and from a bed to a chair (including a wheelchair)?: None Help needed standing up from a chair using your arms (e.g., wheelchair or bedside chair)?: None Help needed to walk in hospital room?: A Little Help needed climbing 3-5 steps with a railing? : A Little 6 Click Score: 22    End of Session Equipment Utilized During Treatment: Gait belt;Back brace Activity Tolerance: Patient tolerated treatment well Patient left: in chair;with call bell/phone within reach Nurse Communication: Mobility status PT Visit Diagnosis: Unsteadiness on feet (R26.81);Pain Pain - part of body:  (back)    Time: 0737-1062 PT Time Calculation (min) (ACUTE ONLY): 17 min   Charges:   PT Evaluation $PT Eval Low Complexity: 1 Low          Rolinda Roan, PT, DPT Acute Rehabilitation  Services Pager: [phone] Office: [phone]   Thelma Comp 12/26/2020, 9:36 AM

## 2020-12-26 NOTE — Progress Notes (Addendum)
Pt has worker's Scientist, research (medical), CSW asked to assist with getting pt bariatric 3n1.  CSW spoke with pt and wife, worker's comp contact is Janne Napoleon, [place].  CSW spoke with Ms Mallie Mussel who requested the order be faxed to [phone].  Due to short notice, 3n1 will be sent to pt home rather than to the hospital.  Worker's comp would not be able to deliver it to the hospital today. Lurline Idol, MSW, LCSW 4/7/20221:29 PM   1600:  CSW spoke with Janne Napoleon who confirmed she had received the fax and was all set. Lurline Idol, MSW, LCSW 4/7/20224:01 PM

## 2020-12-26 NOTE — Plan of Care (Signed)
Patient alert and oriented, voiding adequately, MAE well with no difficulty. Incision area cdi with no s/s of infection. Patient discharged home per order. Patient and spouse stated understanding of discharge instructions given. Patient has an appointment with Dr. Lynann Bologna

## 2020-12-26 NOTE — Anesthesia Postprocedure Evaluation (Signed)
Anesthesia Post Note  Patient: Brandon Marshall  Procedure(s) Performed: LEFT-SIDED LUMBAR 4 - LUMBAR 5 TRANSFORAMINAL LUMBAR INTERBODY FUSION WITH INSTRUMENTATION AND ALLOGRAFT (Left )     Patient location during evaluation: PACU Anesthesia Type: General Level of consciousness: awake and alert Pain management: pain level controlled Vital Signs Assessment: post-procedure vital signs reviewed and stable Respiratory status: spontaneous breathing, nonlabored ventilation, respiratory function stable and patient connected to nasal cannula oxygen Cardiovascular status: blood pressure returned to baseline and stable Postop Assessment: no apparent nausea or vomiting Anesthetic complications: no   No complications documented.  Last Vitals:  Vitals:   12/26/20 0319 12/26/20 0740  BP: 125/78 (!) 116/56  Pulse: 82 83  Resp: 20 18  Temp: 36.8 C 36.8 C  SpO2: 99% 97%    Last Pain:  Vitals:   12/26/20 0740  TempSrc: Oral  PainSc:                  [name]

## 2020-12-26 NOTE — Progress Notes (Signed)
    Patient doing well  Denies leg pain + expected low back discomfort   Physical Exam: Vitals:   12/25/20 2251 12/26/20 0319  BP: 118/75 125/78  Pulse: 74 82  Resp: 18 20  Temp: 98.2 F (36.8 C) 98.2 F (36.8 C)  SpO2: 98% 99%    Dressing in place NVI  POD #1 s/p L4/5 revision decompression and fusion, doing very well  - up with PT/OT, encourage ambulation - Norco for pain, Robaxin for muscle spasms - d/c home today with f/u in 2 weeks

## 2020-12-27 ENCOUNTER — Encounter (HOSPITAL_COMMUNITY): Payer: Self-pay | Admitting: Orthopedic Surgery

## 2021-01-01 NOTE — Discharge Summary (Signed)
Patient ID: Brandon Marshall MRN: [id number] DOB/AGE: 26-Aug-1971 50 y.o.  Admit date: 12/25/2020 Discharge date: 12/26/2020  Admission Diagnoses:  Active Problems:   Radiculopathy   Discharge Diagnoses:  Same  Past Medical History:  Diagnosis Date  . Diabetes mellitus without complication ([place])   . Elevated liver enzymes   . GERD (gastroesophageal reflux disease)   . Hypertension 2012  . Pneumonia     Surgeries: Procedure(s): LEFT-SIDED LUMBAR 4 - LUMBAR 5 TRANSFORAMINAL LUMBAR INTERBODY FUSION WITH INSTRUMENTATION AND ALLOGRAFT on 12/25/2020   Consultants: None  Discharged Condition: Improved  Hospital Course: Brandon Marshall is an 50 y.o. male who was admitted 12/25/2020 for operative treatment of radiculopathy. Patient has severe unremitting pain that affects sleep, daily activities, and work/hobbies. After pre-op clearance the patient was taken to the operating room on 12/25/2020 and underwent  Procedure(s): LEFT-SIDED LUMBAR 4 - LUMBAR 5 TRANSFORAMINAL LUMBAR INTERBODY FUSION WITH INSTRUMENTATION AND ALLOGRAFT.    Patient was given perioperative antibiotics:  Anti-infectives (From admission, onward)   Start     Dose/Rate Route Frequency Ordered Stop   12/25/20 2100  ceFAZolin (ANCEF) IVPB 2g/100 mL premix        2 g 200 mL/hr over 30 Minutes Intravenous Every 8 hours 12/25/20 1559 12/26/20 0450   12/25/20 0630  ceFAZolin (ANCEF) IVPB 2g/100 mL premix        2 g 200 mL/hr over 30 Minutes Intravenous On call to O.R. 12/25/20 5397 12/25/20 1435   12/25/20 0630  ceFAZolin (ANCEF) IVPB 2g/100 mL premix  Status:  Discontinued        2 g 200 mL/hr over 30 Minutes Intravenous On call to O.R. 12/25/20 0622 12/25/20 0622       Patient was given sequential compression devices, early ambulation to prevent DVT.  Patient benefited maximally from hospital stay and there were no complications.    Recent vital signs: BP (!) 116/56 (BP Location: Left Arm)   Pulse 83   Temp 98.2 F (36.8  C) (Oral)   Resp 18   Ht 5\' 9"  (1.753 m)   Wt 117.9 kg   SpO2 97%   BMI 38.40 kg/m    Discharge Medications:   Allergies as of 12/26/2020      Reactions   Ibuprofen    Avoid due to liver issues   Neuromuscular Blocking Agents    Hyperglycemia    Tylenol [acetaminophen]    Avoid due to liver issues      Medication List    TAKE these medications   aspirin-acetaminophen-caffeine 250-250-65 MG tablet Commonly known as: EXCEDRIN MIGRAINE Take 2 tablets by mouth every 6 (six) hours as needed for headache.   CINNAMON PO Take 1,000 mg by mouth daily.   docusate sodium 100 MG capsule Commonly known as: Colace Take 1 capsule (100 mg total) by mouth 2 (two) times daily.   EMERGEN-C IMMUNE PO Take 2 capsules by mouth daily.   HYDROcodone-acetaminophen 7.5-325 MG tablet Commonly known as: NORCO Take 1-2 tablets by mouth every 4 (four) hours as needed for moderate pain ((score 4 to 6)).   Melatonin 10 MG Caps Take 10 mg by mouth at bedtime as needed (sleep).   metFORMIN 500 MG tablet Commonly known as: GLUCOPHAGE Take 500 mg by mouth 2 (two) times daily.   methocarbamol 500 MG tablet Commonly known as: ROBAXIN Take 1 tablet (500 mg total) by mouth every 6 (six) hours as needed for muscle spasms.   pantoprazole 40 MG tablet Commonly known  as: PROTONIX Take 40 mg by mouth daily.   propranolol ER 160 MG SR capsule Commonly known as: INDERAL LA Take 160 mg by mouth daily.       Diagnostic Studies: DG Lumbar Spine 2-3 Views  Result Date: 12/25/2020 CLINICAL DATA:  L4-L5 interbody fusion. EXAM: DG C-ARM 1-60 MIN; LUMBAR SPINE - 2-3 VIEW FLUOROSCOPY TIME:  Fluoroscopy Time:  1 minutes and 30 seconds. Radiation Exposure Index (if provided by the fluoroscopic device): 70.73 mGy. Number of Acquired Spot Images: 2. COMPARISON:  Same day radiographs. FINDINGS: Two C-arm fluoroscopic images were obtained intraoperatively and submitted for post operative interpretation. These images  demonstrate bilateral pedicle screws at L4 and L5 with intervening rods and intervening L4-L5 spacer. Second image demonstrates suspected sponge projecting at the left paraspinal region. Please see the performing provider's procedural report for further detail. IMPRESSION: L4-L5 PLIF. Electronically Signed   By: Margaretha Sheffield MD   On: 12/25/2020 14:38   DG Lumbar Spine 1 View  Result Date: 12/25/2020 CLINICAL DATA:  Lumbar fusion with needle placements for localization EXAM: LUMBAR SPINE - 1 VIEW COMPARISON:  Lumbar MRI September 19, 2020 FINDINGS: Lateral lumbar image time stamped 9:01:46 submitted. Metallic probe tips are posterior to the superior aspect of the L4 vertebral body and posterior to the inferior aspect of the L5 vertebral body respectively. No fracture or spondylolisthesis. Disc spaces appear unremarkable. IMPRESSION: Metallic probe tips posterior to the superior aspect of L4 in the inferior aspect of L5 respectively. No fracture or spondylolisthesis. Electronically Signed   By: Lowella Grip III M.D.   On: 12/25/2020 13:16   DG C-Arm 1-60 Min  Result Date: 12/25/2020 CLINICAL DATA:  L4-L5 interbody fusion. EXAM: DG C-ARM 1-60 MIN; LUMBAR SPINE - 2-3 VIEW FLUOROSCOPY TIME:  Fluoroscopy Time:  1 minutes and 30 seconds. Radiation Exposure Index (if provided by the fluoroscopic device): 70.73 mGy. Number of Acquired Spot Images: 2. COMPARISON:  Same day radiographs. FINDINGS: Two C-arm fluoroscopic images were obtained intraoperatively and submitted for post operative interpretation. These images demonstrate bilateral pedicle screws at L4 and L5 with intervening rods and intervening L4-L5 spacer. Second image demonstrates suspected sponge projecting at the left paraspinal region. Please see the performing provider's procedural report for further detail. IMPRESSION: L4-L5 PLIF. Electronically Signed   By: Margaretha Sheffield MD   On: 12/25/2020 14:38    Disposition: Discharge disposition:  01-Home or Self Care        POD #1 s/p L4/5 revision decompression and fusion, doing very well  - up with PT/OT, encourage ambulation - Norco for pain, Robaxin for muscle spasms -Scripts for pain sent to pharmacy electronically  -D/C instructions sheet printed and in chart -D/C today  -F/U in office 2 weeks   Signed: Lennie Muckle Darryel Diodato 01/01/2021, 3:21 PM

## 2021-03-11 DIAGNOSIS — I10 Essential (primary) hypertension: Secondary | ICD-10-CM | POA: Diagnosis not present

## 2021-03-11 DIAGNOSIS — G43009 Migraine without aura, not intractable, without status migrainosus: Secondary | ICD-10-CM | POA: Diagnosis not present

## 2021-03-11 DIAGNOSIS — E119 Type 2 diabetes mellitus without complications: Secondary | ICD-10-CM | POA: Diagnosis not present

## 2021-03-11 DIAGNOSIS — K219 Gastro-esophageal reflux disease without esophagitis: Secondary | ICD-10-CM | POA: Diagnosis not present

## 2021-06-11 DIAGNOSIS — Z79899 Other long term (current) drug therapy: Secondary | ICD-10-CM | POA: Diagnosis not present

## 2021-06-11 DIAGNOSIS — E119 Type 2 diabetes mellitus without complications: Secondary | ICD-10-CM | POA: Diagnosis not present

## 2021-06-11 DIAGNOSIS — Z125 Encounter for screening for malignant neoplasm of prostate: Secondary | ICD-10-CM | POA: Diagnosis not present

## 2021-06-11 DIAGNOSIS — E7800 Pure hypercholesterolemia, unspecified: Secondary | ICD-10-CM | POA: Diagnosis not present

## 2021-06-11 DIAGNOSIS — I10 Essential (primary) hypertension: Secondary | ICD-10-CM | POA: Diagnosis not present

## 2021-06-11 DIAGNOSIS — G43009 Migraine without aura, not intractable, without status migrainosus: Secondary | ICD-10-CM | POA: Diagnosis not present

## 2021-12-10 DIAGNOSIS — Z1331 Encounter for screening for depression: Secondary | ICD-10-CM | POA: Diagnosis not present

## 2021-12-10 DIAGNOSIS — Z23 Encounter for immunization: Secondary | ICD-10-CM | POA: Diagnosis not present

## 2021-12-10 DIAGNOSIS — G43009 Migraine without aura, not intractable, without status migrainosus: Secondary | ICD-10-CM | POA: Diagnosis not present

## 2021-12-10 DIAGNOSIS — E7800 Pure hypercholesterolemia, unspecified: Secondary | ICD-10-CM | POA: Diagnosis not present

## 2021-12-10 DIAGNOSIS — I10 Essential (primary) hypertension: Secondary | ICD-10-CM | POA: Diagnosis not present

## 2021-12-10 DIAGNOSIS — E119 Type 2 diabetes mellitus without complications: Secondary | ICD-10-CM | POA: Diagnosis not present

## 2022-03-09 IMAGING — MR MR LUMBAR SPINE WO/W CM
5 of 7 series · 23 of 48 positions shown · IV contrast (20 ml multihance)
Comparison: 04/30/2020

CLINICAL DATA: Left low back pain into left leg, injury February 2020

EXAM:
MRI LUMBAR SPINE WITHOUT AND WITH CONTRAST
TECHNIQUE: Multiplanar and multiecho pulse sequences of the lumbar spine were
obtained without and with intravenous contrast.
CONTRAST:  20mL MULTIHANCE GADOBENATE DIMEGLUMINE 529 MG/ML IV SOLN

[Series 5: T1 · sagittal · 4.0mm · 0.73mm/px · 3 of 13 slices shown (1 of 2)]
[im 1/13]
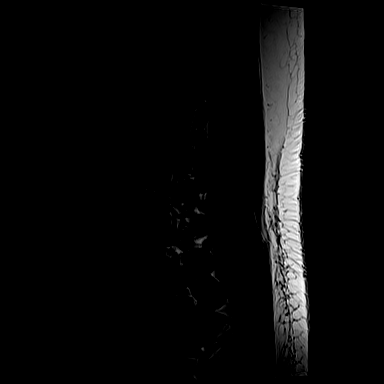
[im 7/13]
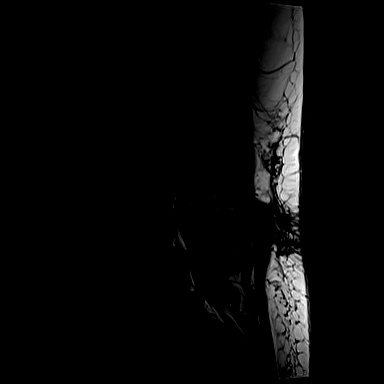
[im 13/13]
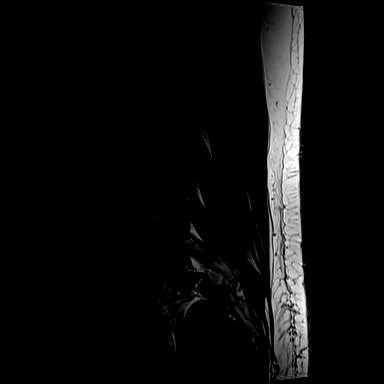

[Series 9: T2 · axial · 4.0mm · 0.35mm/px · z∈[-101,+119]mm · 11 of 39 slices shown (1 of 2)]
[im 1/39]
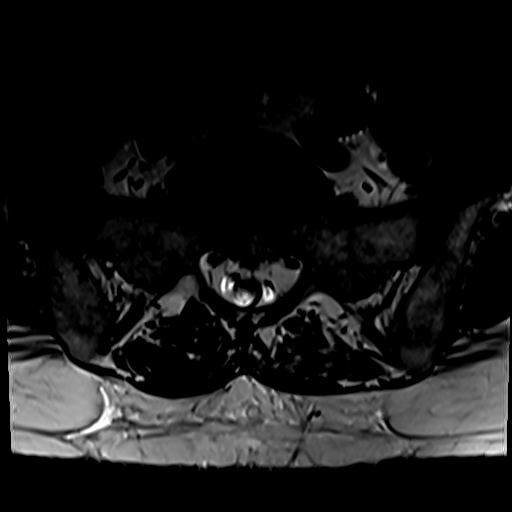
[im 4/39]
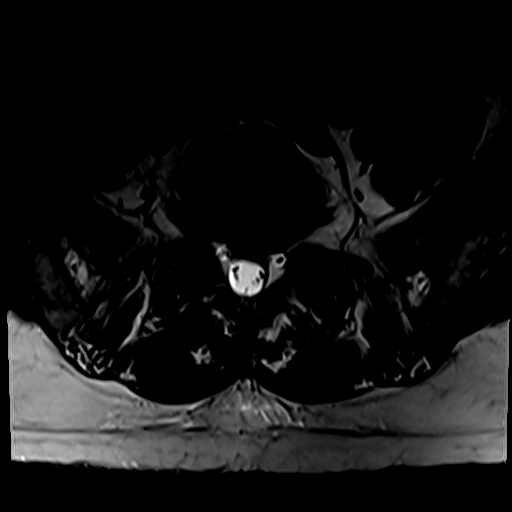
[im 8/39]
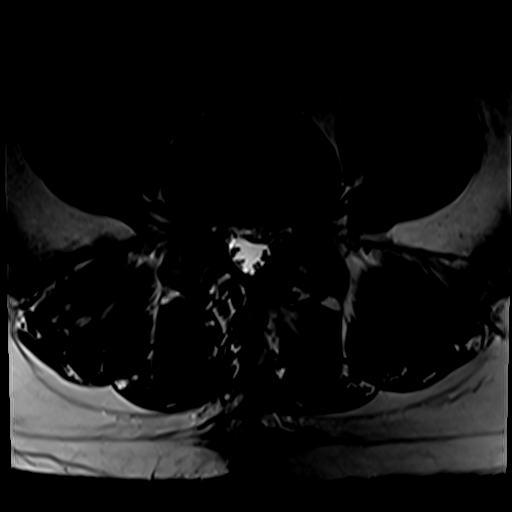
[im 12/39]
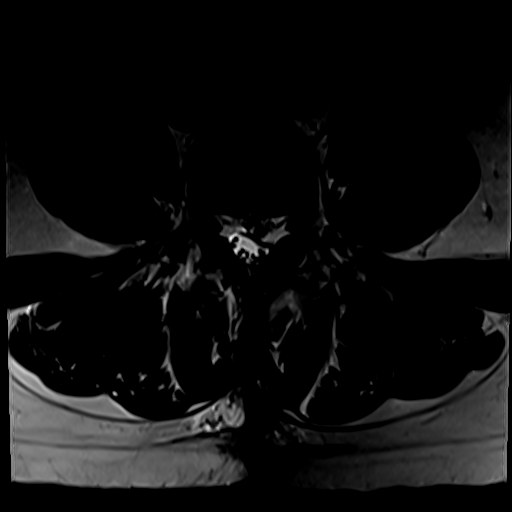
[im 16/39]
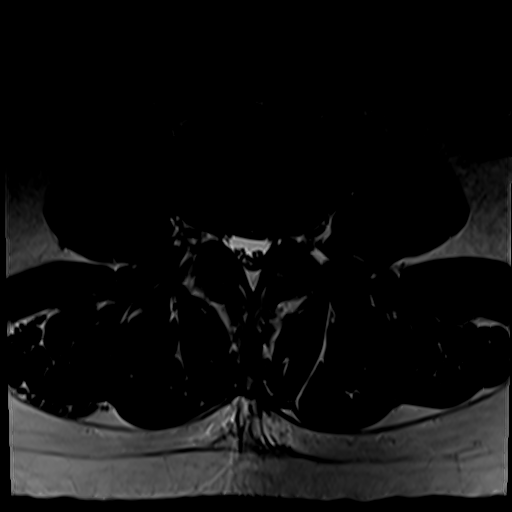
[im 20/39]
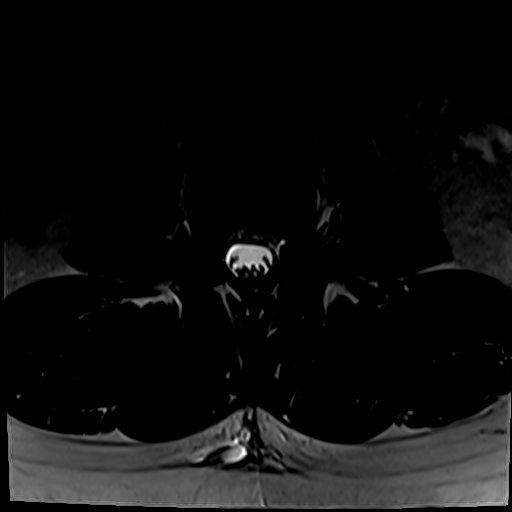
[im 23/39]
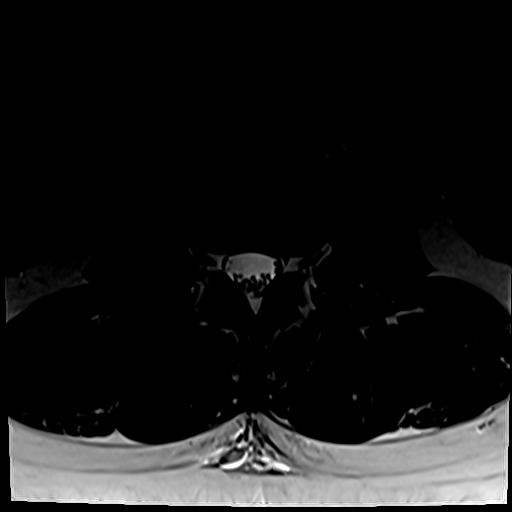
[im 27/39]
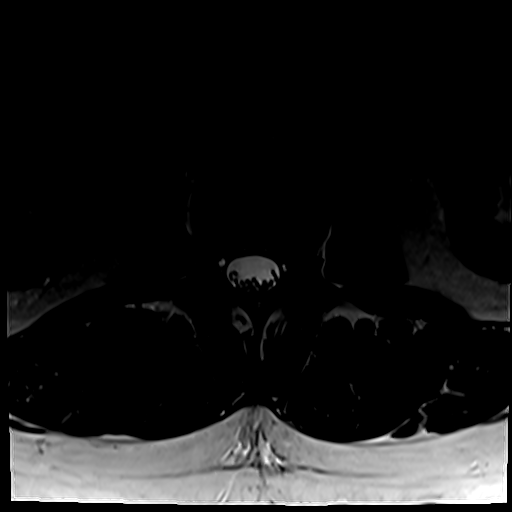
[im 31/39]
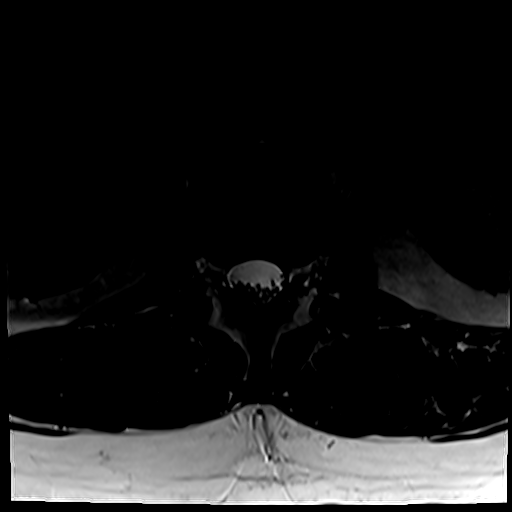
[im 35/39]
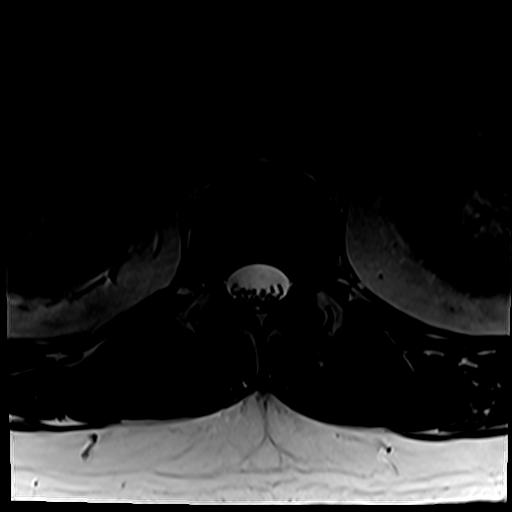
[im 39/39]
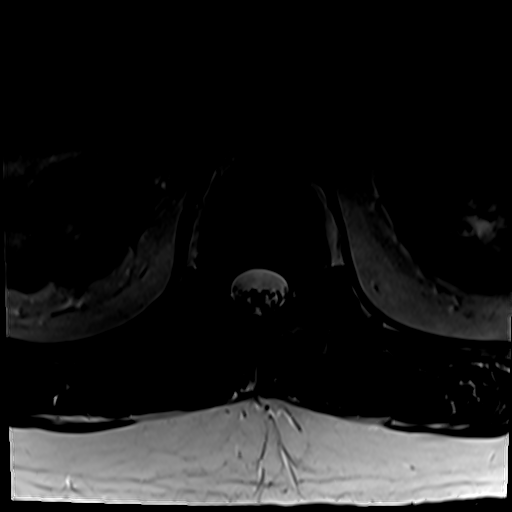

[Series 12: T2 · sagittal · 4.0mm · 0.73mm/px · 4 of 13 slices shown (2 of 2)]
[im 1/13]
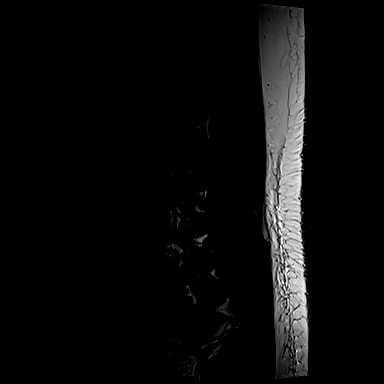
[im 5/13]
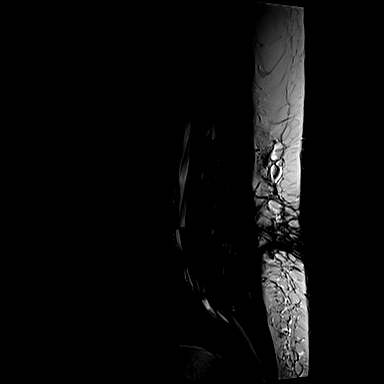
[im 9/13]
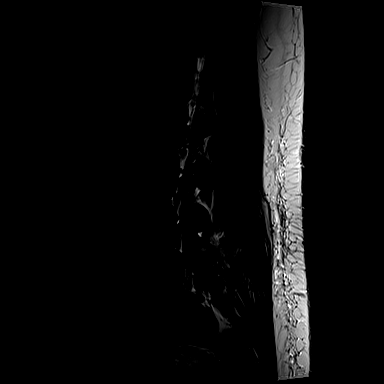
[im 13/13]
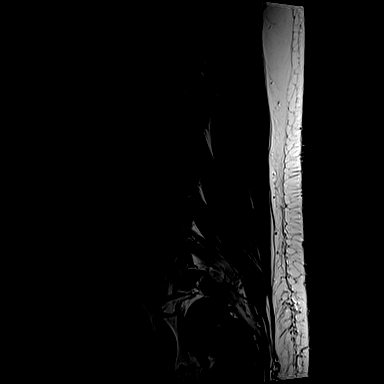

[Series 13: T1 fat-sat post-contrast · sagittal · 4.0mm · 0.73mm/px · 4 of 13 slices shown]
[im 1/13]
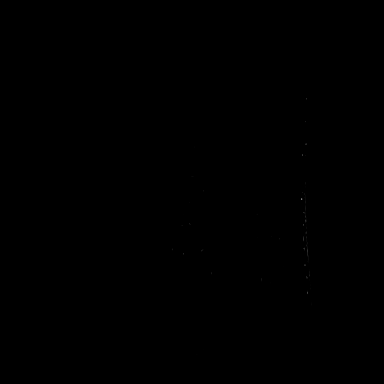
[im 5/13]
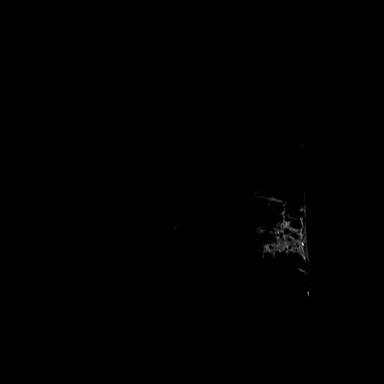
[im 9/13]
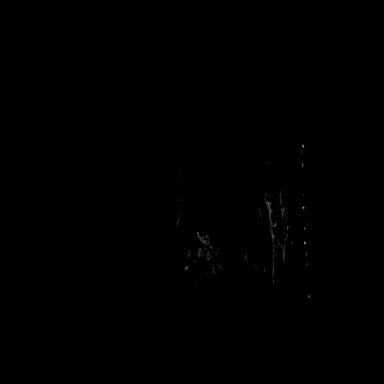
[im 13/13]
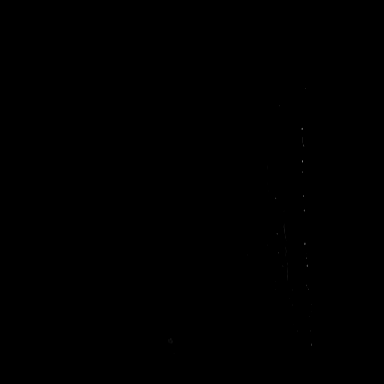

[Series 16: T1 · axial · 4.0mm · 0.35mm/px · 1 of 39 slices shown (2 of 2)]
[im 1/39]
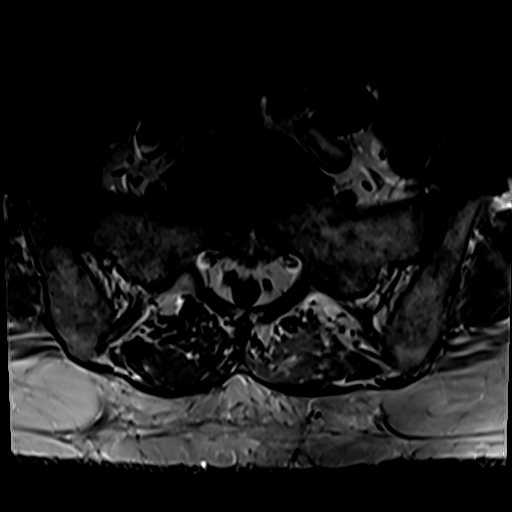

[23 of 48 positions shown; findings below may reference images not displayed]

FINDINGS: Segmentation:  Standard.

Alignment: Anteroposterior alignment is unchanged. No significant
listhesis.

Vertebrae: Stable vertebral body heights. No substantial marrow
edema. No suspicious osseous lesion.

Conus medullaris and cauda equina: Conus extends to the T12-L1
level. Conus and cauda equina appear normal.

Paraspinal and other soft tissues: Interval postoperative changes.
Otherwise unremarkable.

Disc levels:

L1-L2:  No canal or foraminal stenosis.

L2-L3:  No canal or foraminal stenosis.

L3-L4: Minimal disc bulge with superimposed trace central
protrusion. No canal or foraminal stenosis.

L4-L5: Interval left laminectomy. There is expected postoperative
enhancing soft tissue in the laminectomy bed extending into the left
lateral epidural space. Disc bulge. Left ventral epidural soft
tissue with enhancement at the periphery. Mild facet arthropathy.
Effacement of the left subarticular recess with compression of
traversing L5 nerve roots. Mild left greater than right foraminal
stenosis is unchanged.

L5-S1: Disc bulge eccentric to the left. Mild facet arthropathy. No
canal stenosis. Mild narrowing of the left subarticular recess. Mild
to moderate right foraminal stenosis. No left foraminal stenosis.
Appearance is similar.
IMPRESSION: Interval left laminectomy at L4-L5 with combination of
residual/recurrent disc protrusion and postoperative granulation
tissue effacing the left subarticular recess with compression of
traversing L5 nerve roots.

Otherwise stable appearance.

## 2022-06-22 DIAGNOSIS — Z23 Encounter for immunization: Secondary | ICD-10-CM | POA: Diagnosis not present

## 2022-06-22 DIAGNOSIS — E7800 Pure hypercholesterolemia, unspecified: Secondary | ICD-10-CM | POA: Diagnosis not present

## 2022-06-22 DIAGNOSIS — G43009 Migraine without aura, not intractable, without status migrainosus: Secondary | ICD-10-CM | POA: Diagnosis not present

## 2022-06-22 DIAGNOSIS — I10 Essential (primary) hypertension: Secondary | ICD-10-CM | POA: Diagnosis not present

## 2022-06-22 DIAGNOSIS — E119 Type 2 diabetes mellitus without complications: Secondary | ICD-10-CM | POA: Diagnosis not present

## 2022-06-22 DIAGNOSIS — Z125 Encounter for screening for malignant neoplasm of prostate: Secondary | ICD-10-CM | POA: Diagnosis not present

## 2022-12-22 DIAGNOSIS — E119 Type 2 diabetes mellitus without complications: Secondary | ICD-10-CM | POA: Diagnosis not present

## 2022-12-22 DIAGNOSIS — I10 Essential (primary) hypertension: Secondary | ICD-10-CM | POA: Diagnosis not present

## 2022-12-22 DIAGNOSIS — G43009 Migraine without aura, not intractable, without status migrainosus: Secondary | ICD-10-CM | POA: Diagnosis not present

## 2022-12-22 DIAGNOSIS — Z79899 Other long term (current) drug therapy: Secondary | ICD-10-CM | POA: Diagnosis not present

## 2022-12-22 DIAGNOSIS — E7800 Pure hypercholesterolemia, unspecified: Secondary | ICD-10-CM | POA: Diagnosis not present

## 2022-12-22 DIAGNOSIS — Z1331 Encounter for screening for depression: Secondary | ICD-10-CM | POA: Diagnosis not present

## 2023-10-01 DIAGNOSIS — G43009 Migraine without aura, not intractable, without status migrainosus: Secondary | ICD-10-CM | POA: Diagnosis not present

## 2023-10-01 DIAGNOSIS — E7800 Pure hypercholesterolemia, unspecified: Secondary | ICD-10-CM | POA: Diagnosis not present

## 2023-10-01 DIAGNOSIS — Z125 Encounter for screening for malignant neoplasm of prostate: Secondary | ICD-10-CM | POA: Diagnosis not present

## 2023-10-01 DIAGNOSIS — I10 Essential (primary) hypertension: Secondary | ICD-10-CM | POA: Diagnosis not present

## 2023-10-01 DIAGNOSIS — E119 Type 2 diabetes mellitus without complications: Secondary | ICD-10-CM | POA: Diagnosis not present

## 2023-11-26 DIAGNOSIS — I10 Essential (primary) hypertension: Secondary | ICD-10-CM | POA: Diagnosis not present

## 2023-11-26 DIAGNOSIS — G43009 Migraine without aura, not intractable, without status migrainosus: Secondary | ICD-10-CM | POA: Diagnosis not present

## 2024-01-14 DIAGNOSIS — E7800 Pure hypercholesterolemia, unspecified: Secondary | ICD-10-CM | POA: Diagnosis not present

## 2024-01-14 DIAGNOSIS — I10 Essential (primary) hypertension: Secondary | ICD-10-CM | POA: Diagnosis not present

## 2024-01-14 DIAGNOSIS — E119 Type 2 diabetes mellitus without complications: Secondary | ICD-10-CM | POA: Diagnosis not present

## 2024-01-14 DIAGNOSIS — Z1331 Encounter for screening for depression: Secondary | ICD-10-CM | POA: Diagnosis not present

## 2024-01-14 DIAGNOSIS — K219 Gastro-esophageal reflux disease without esophagitis: Secondary | ICD-10-CM | POA: Diagnosis not present

## 2024-04-28 DIAGNOSIS — E7800 Pure hypercholesterolemia, unspecified: Secondary | ICD-10-CM | POA: Diagnosis not present

## 2024-04-28 DIAGNOSIS — G43009 Migraine without aura, not intractable, without status migrainosus: Secondary | ICD-10-CM | POA: Diagnosis not present

## 2024-04-28 DIAGNOSIS — I10 Essential (primary) hypertension: Secondary | ICD-10-CM | POA: Diagnosis not present

## 2024-04-28 DIAGNOSIS — E119 Type 2 diabetes mellitus without complications: Secondary | ICD-10-CM | POA: Diagnosis not present

## 2024-08-02 DIAGNOSIS — I10 Essential (primary) hypertension: Secondary | ICD-10-CM | POA: Diagnosis not present

## 2024-08-02 DIAGNOSIS — E1169 Type 2 diabetes mellitus with other specified complication: Secondary | ICD-10-CM | POA: Diagnosis not present

## 2024-08-02 DIAGNOSIS — Z23 Encounter for immunization: Secondary | ICD-10-CM | POA: Diagnosis not present

## 2024-08-02 DIAGNOSIS — E7800 Pure hypercholesterolemia, unspecified: Secondary | ICD-10-CM | POA: Diagnosis not present

## 2024-08-02 DIAGNOSIS — K219 Gastro-esophageal reflux disease without esophagitis: Secondary | ICD-10-CM | POA: Diagnosis not present
